# Patient Record
Sex: Female | Born: 1935 | Hispanic: No | Smoking: Former smoker
Health system: Southern US, Community
[De-identification: ages and names within clinical notes are randomized; demographics above are authoritative.]

## PROBLEM LIST (undated history)

## (undated) DIAGNOSIS — N189 Chronic kidney disease, unspecified: Secondary | ICD-10-CM

## (undated) DIAGNOSIS — I4891 Unspecified atrial fibrillation: Secondary | ICD-10-CM

## (undated) DIAGNOSIS — D649 Anemia, unspecified: Secondary | ICD-10-CM

## (undated) DIAGNOSIS — K219 Gastro-esophageal reflux disease without esophagitis: Secondary | ICD-10-CM

---

## 2008-02-23 ENCOUNTER — Ambulatory Visit (HOSPITAL_BASED_OUTPATIENT_CLINIC_OR_DEPARTMENT_OTHER): Admission: RE | Admit: 2008-02-23 | Discharge: 2008-02-23 | Payer: Self-pay | Admitting: Orthopedic Surgery

## 2011-02-26 NOTE — Op Note (Signed)
Mary Mcclain, Mary Mcclain              ACCOUNT NO.:  [id number]   MEDICAL RECORD NO.:  [id number]          PATIENT TYPE:  AMB   LOCATION:  [facility]                         FACILITY:  [facility]   PHYSICIAN:  Deidre Ala, M.D.    DATE OF BIRTH:  [birth year]   DATE OF PROCEDURE:  02/23/2008  DATE OF DISCHARGE:                               OPERATIVE REPORT   PREOPERATIVE DIAGNOSES:  1. Right second hammertoe deformity.  2. Right second metatarsalgia.  3. Right third metatarsalgia.  4. Left second hammertoe deformity.  5. Left second metatarsalgia.  6. Left third metatarsalgia.   POSTOPERATIVE DIAGNOSES:  1. Right second hammertoe deformity.  2. Right second metatarsalgia.  3. Right third metatarsalgia.  4. Left second hammertoe deformity.  5. Left second metatarsalgia.  6. Left third metatarsalgia.   PROCEDURES:  1. Right second toe DeVries hammertoe correction.  2. Dorsal wedge osteotomy second metatarsal neck with local autograft.  3. Dorsal wedge osteotomy third metatarsal neck with local autograft.  4. Left second hammertoe DeVries correction procedure.  5. Left second dorsal wedge osteotomy metatarsal neck with local      autograft.  6. Left third dorsal wedge osteotomy at metatarsal neck with local      autograft.   SURGEON:  1. Charlesetta Shanks, M.D.   ASSISTANT:  Phineas Semen, P.A.   ANESTHESIA:  Ankle blocks with general with face mask.   CULTURES:  None.   DRAINS:  None.   BLOOD LOSS:  Minimal.   TOURNIQUET TIME:  Right Esmarch 30 minutes, left 30 minutes.   PATHOLOGIC FINDINGS AND HISTORY:  Mary Mcclain had had in the past simple  bunionectomies but had residual hallux valgus due to the splayed medial  foot.  She had transfer second and third metatarsalgia with over-long  second and third metatarsals and a left and right hammertoe.  The second  and third metatarsalgia was bilateral.  The hallux valgus was bilateral.  The simple bunionectomies where bilateral with  recurrent hallux valgus  deformity but the MTP joint did not bother her on any side.  So  basically, she had bilateral second hammertoe deformities and second and  third metatarsalgia with over-long second and third metatarsal heads  with plantar callosities.  She did not have a Morton's neuroma.  It was  elected to be conservative and not deal with metatarsus primus varus or  the hallux valgus or the bunion but simply to correct the painful second  hammertoe which was not particularly over or under-riding and the dorsal  wedge on the osteotomies to a single incision on the second and third.  This is what was carried out.   PROCEDURE:  With adequate anesthesia obtained using ankle block  technique and general with face mask, the patient was placed in the  supine position.  Bilateral lower extremities were then prepped from  toes to the upper calf in the standard fashion.  After standard prepping  and draping, right ankle Esmarch examination was used and the Esmarch  left on above the ankle for the tourniquet.  I then made an incision in  the  dorsal web between 2 and 3 and dissected down to the second  metatarsal neck where soft tissue periosteal elevator was used and  retractors placed.  A dorsal wedge osteotomy was then carried out with  an oscillating saw and the skin wedge was morselized and used as bone  graft as the metatarsal head was cracked upward to achieve angular  correction and shortening and not to be so prominent on the bottom of  the foot.  Through the same incision, the exact same procedure was done  on the third.  I then closed the wound with running 4-0 nylon.  Attention was then turned to the second toe where dorsal wedge of skin  in the manner of Mary Mcclain was carried out with the wedge of skin removed  over the PIP joint.  I then excised the extensor tendon.  I then, in a  wedge-shaped fashion with the saw, resected with the apex plantar, the  PIP joint cartilage and  joint surface.  I then released the flexor  tendon.  I then brought the osteotomy together straightening the kinked  hammertoe flexed at the PIP joint to a physiologic curvature of the toe  that rested nicely between the first and second toes.  I then placed two  bolsters on either side of the wound and brought them up with 4-0 nylon  extending the PIP joint and suturing either side of the skin in likewise  fashion.  This was a vertical mattress suture that was used on the  bolsters.  The bolsters were made with a Xeroflo 1/2 inch rolled up.  This corrected the hammertoe.  No additional fixation was used.  Bulky  sterile compressive forefoot dressing was applied after application  locally of 0.25% Marcaine.  That tourniquet was let down after the  dressing was applied.  Attention was turned to the left foot where the  exact same procedure was performed.  The patient then, having tolerated  the procedure well, was awakened and taken to the recovery room in  satisfactory condition, to be discharged per outpatient routine, given  Percocet for pain and told to call the office for appointment for  recheck on Friday.  To be given wooden sole shoes, walking with  weightbearing on her heels, elevation and encroaches.   LABORATORY DATA:  Within normal limits.           ______________________________  V. Charlesetta Shanks, M.D.     VEP/MEDQ  D:  02/23/2008  T:  02/23/2008  Job:  [id number]

## 2015-12-04 ENCOUNTER — Emergency Department (HOSPITAL_COMMUNITY): Payer: Medicare Other

## 2015-12-04 ENCOUNTER — Encounter (HOSPITAL_COMMUNITY): Payer: Self-pay | Admitting: Emergency Medicine

## 2015-12-04 ENCOUNTER — Emergency Department (HOSPITAL_COMMUNITY)
Admission: EM | Admit: 2015-12-04 | Discharge: 2015-12-04 | Disposition: A | Discharge status: Home / Self Care | Payer: Medicare Other | Attending: Emergency Medicine | Admitting: Emergency Medicine

## 2015-12-04 DIAGNOSIS — F0390 Unspecified dementia without behavioral disturbance: Secondary | ICD-10-CM | POA: Diagnosis not present

## 2015-12-04 DIAGNOSIS — Y9389 Activity, other specified: Secondary | ICD-10-CM | POA: Insufficient documentation

## 2015-12-04 DIAGNOSIS — N189 Chronic kidney disease, unspecified: Secondary | ICD-10-CM | POA: Insufficient documentation

## 2015-12-04 DIAGNOSIS — Y998 Other external cause status: Secondary | ICD-10-CM | POA: Diagnosis not present

## 2015-12-04 DIAGNOSIS — Z862 Personal history of diseases of the blood and blood-forming organs and certain disorders involving the immune mechanism: Secondary | ICD-10-CM | POA: Diagnosis not present

## 2015-12-04 DIAGNOSIS — S79911A Unspecified injury of right hip, initial encounter: Secondary | ICD-10-CM | POA: Diagnosis not present

## 2015-12-04 DIAGNOSIS — W19XXXA Unspecified fall, initial encounter: Secondary | ICD-10-CM

## 2015-12-04 DIAGNOSIS — Y9289 Other specified places as the place of occurrence of the external cause: Secondary | ICD-10-CM | POA: Insufficient documentation

## 2015-12-04 DIAGNOSIS — S3992XA Unspecified injury of lower back, initial encounter: Secondary | ICD-10-CM | POA: Diagnosis present

## 2015-12-04 DIAGNOSIS — Z79899 Other long term (current) drug therapy: Secondary | ICD-10-CM | POA: Insufficient documentation

## 2015-12-04 DIAGNOSIS — Z87891 Personal history of nicotine dependence: Secondary | ICD-10-CM | POA: Diagnosis not present

## 2015-12-04 DIAGNOSIS — Z8679 Personal history of other diseases of the circulatory system: Secondary | ICD-10-CM | POA: Diagnosis not present

## 2015-12-04 DIAGNOSIS — W010XXA Fall on same level from slipping, tripping and stumbling without subsequent striking against object, initial encounter: Secondary | ICD-10-CM | POA: Insufficient documentation

## 2015-12-04 HISTORY — DX: Unspecified atrial fibrillation: I48.91

## 2015-12-04 HISTORY — DX: Anemia, unspecified: D64.9

## 2015-12-04 HISTORY — DX: Chronic kidney disease, unspecified: N18.9

## 2015-12-04 NOTE — Discharge Instructions (Signed)
Tylenol for pain.  Follow up if needed 

## 2015-12-04 NOTE — ED Provider Notes (Signed)
CSN: [id number]     Arrival date & time 12/04/15  1929 History   First MD Initiated Contact with Patient 12/04/15 2008     Chief Complaint  Patient presents with  . Fall  . Hip Pain  . Back Pain     (Consider location/radiation/quality/duration/timing/severity/associated sxs/prior Treatment) Patient is a 82 y.o. female presenting with fall, hip pain, and back pain. The history is provided by the EMS personnel (EMS states that there are told the patient was in the chair stood up and kind of slid down and fell. Unknown loss of consciousness).  Fall This is a new problem. The current episode started 1 to 2 hours ago. The problem occurs rarely. The problem has been resolved. Pertinent negatives include no chest pain and no abdominal pain. Nothing aggravates the symptoms. Nothing relieves the symptoms.  Hip Pain Pertinent negatives include no chest pain and no abdominal pain.  Back Pain Associated symptoms: no abdominal pain and no chest pain     Past Medical History  Diagnosis Date  . Chronic kidney disease (CKD)   . Atrial fibrillation (HCC)   . Anemia    History reviewed. No pertinent past surgical history. History reviewed. No pertinent family history. Social History  Substance Use Topics  . Smoking status: Former Games developer  . Smokeless tobacco: Never Used  . Alcohol Use: No   OB History    No data available     Review of Systems  Unable to perform ROS: Dementia  Cardiovascular: Negative for chest pain.  Gastrointestinal: Negative for abdominal pain.  Musculoskeletal: Positive for back pain.      Allergies  Sulfa antibiotics  Home Medications   Prior to Admission medications   Medication Sig Start Date End Date Taking? Authorizing Provider  acetaminophen (TYLENOL) 325 MG tablet Take 650 mg by mouth every 4 (four) hours as needed for mild pain.   Yes Historical Provider, MD  Amino Acids-Protein Hydrolys (FEEDING SUPPLEMENT, PRO-STAT SUGAR FREE 64,) LIQD Take 30 mLs  by mouth 2 (two) times daily.   Yes Historical Provider, MD  cholecalciferol (VITAMIN D) 1000 units tablet Take 1,000 Units by mouth daily.   Yes Historical Provider, MD  pantoprazole (PROTONIX) 40 MG tablet Take 40 mg by mouth daily. 12/01/15  Yes Historical Provider, MD  sertraline (ZOLOFT) 25 MG tablet Take 25 mg by mouth daily. 12/01/15  Yes Historical Provider, MD   BP 141/87 mmHg  Pulse 105  Temp(Src) 98.2 F (36.8 C) (Oral)  Resp 17  SpO2 92% Physical Exam  Constitutional: She appears well-developed.  HENT:  Head: Normocephalic.  Eyes: Conjunctivae and EOM are normal. No scleral icterus.  Neck: Neck supple. No thyromegaly present.  Cardiovascular: Normal rate and regular rhythm.  Exam reveals no gallop and no friction rub.   No murmur heard. Pulmonary/Chest: No stridor. She has no wheezes. She has no rales. She exhibits no tenderness.  Abdominal: She exhibits no distension. There is no tenderness. There is no rebound.  Musculoskeletal: Normal range of motion. She exhibits no edema.  Minor lumbar tenderness and right hip tenderness  Lymphadenopathy:    She has no cervical adenopathy.  Neurological: She is alert. She exhibits normal muscle tone. Coordination normal.  Alert oriented to person only  Skin: No rash noted. No erythema.    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 EXAM: LUMBAR SPINE - COMPLETE 4+ VIEW COMPARISON:  None. FINDINGS: Five non rib-bearing lumbar vertebrae with S1 representing a transitional segment, having a well defined disc space between it and S2. Anatomic alignment. Mild compression fracture of the upper endplate of L1 on the order of 20% or so which does not appear acute. No fractures elsewhere. Moderate  to severe degenerative disc disease and spondylosis at every lumbar level, worst at L2-3 and L4-5. No pars defects. Facet degenerative changes bilaterally at L4-5 and L5-S1. Sacroiliac joints intact. Degenerative changes involving the visualized hip joints. Aortoiliac atherosclerosis without aneurysm. IMPRESSION: 1. No acute osseous abnormality. 2. Old mild compression fracture of the upper endplate of L1 on the order of 20% or so. 3. Moderate to severe multilevel degenerative disc disease and spondylosis, worst at L2-3 and L4-5. Bilateral facet degenerative changes at L4-5 and L5-S1. Electronically Signed   By: Hulan Saas M.D.   On: 12/04/2015 20:57   Ct Head Wo Contrast  12/04/2015  CLINICAL DATA:  82 year old female post unwitnessed fall today. No loss of consciousness. EXAM: CT HEAD WITHOUT CONTRAST CT CERVICAL SPINE WITHOUT CONTRAST TECHNIQUE: Multidetector CT imaging of the head and cervical spine was performed following the standard protocol without intravenous contrast. Multiplanar CT image reconstructions of the cervical spine were also generated. COMPARISON:  None. FINDINGS: CT HEAD FINDINGS The generalized atrophy and moderate to advanced chronic small vessel ischemic change. No intracranial hemorrhage, mass effect, or midline shift. No hydrocephalus. The basilar cisterns are patent. No evidence of territorial infarct. No intracranial fluid collection. Calvarium is intact. Included paranasal sinuses and mastoid air cells are well aerated. CT CERVICAL SPINE FINDINGS There is no fracture. The dens is intact. There are no jumped or perched facets. Disc space narrowing from C4-C5 through C6-C7 with endplate spurring. Scattered facet arthropathy. No prevertebral soft tissue edema. IMPRESSION: 1. Atrophy and chronic small vessel ischemia, no acute intracranial abnormality. 2. Degenerative change in the cervical spine without acute fracture or subluxation. Electronically Signed   By: Rubye Oaks  M.D.   On: 12/04/2015 21:15   Ct Cervical Spine Wo Contrast  12/04/2015  CLINICAL DATA:  82 year old female post unwitnessed fall today. No loss of consciousness. EXAM: CT HEAD WITHOUT CONTRAST CT CERVICAL SPINE WITHOUT CONTRAST TECHNIQUE: Multidetector CT imaging of the head and cervical spine was performed following the standard protocol without intravenous contrast. Multiplanar CT image reconstructions of the cervical spine were also generated. COMPARISON:  None. FINDINGS: CT HEAD FINDINGS The generalized atrophy and moderate to advanced chronic small vessel ischemic change. No intracranial hemorrhage, mass effect, or midline shift. No hydrocephalus. The basilar cisterns are patent. No evidence of territorial infarct. No intracranial fluid collection. Calvarium is intact. Included paranasal sinuses and mastoid air cells are well aerated. CT CERVICAL SPINE FINDINGS There is no fracture. The dens is intact. There are no jumped or perched facets. Disc space narrowing from C4-C5 through C6-C7 with endplate spurring. Scattered facet arthropathy. No prevertebral soft tissue edema. IMPRESSION: 1. Atrophy and chronic small vessel ischemia, no acute intracranial abnormality. 2. Degenerative change in the cervical spine without acute fracture or subluxation. Electronically Signed   By: Rubye Oaks M.D.   On: 12/04/2015 21:15   Dg Hip Unilat With Pelvis 2-3 Views Right  12/04/2015  CLINICAL DATA:  Unwitnessed fall around 4 p.m. today. EXAM: DG HIP (WITH OR WITHOUT PELVIS) 2-3V RIGHT COMPARISON:  None.  FINDINGS: Frontal pelvis shows no fracture. SI joints and symphysis pubis are unremarkable. AP and frog-leg lateral views of the right hip show no evidence for right femoral neck fracture. IMPRESSION: Negative. Electronically Signed   By: Kennith Center M.D.   On: 12/04/2015 20:56   I have personally reviewed and evaluated these images and lab results as part of my medical decision-making.   EKG  Interpretation None      MDM   Final diagnoses:  Fall, initial encounter    CT head CT cervical spine lumbar spine and right hip show no acute changes patient presently not in any pain she will be discharged and will take Tylenol as needed    Bethann Berkshire, MD 12/04/15 2140

## 2015-12-04 NOTE — ED Notes (Signed)
PTAR picking up patient. 

## 2015-12-04 NOTE — ED Notes (Signed)
Patient is discharged waiting on PTAR 

## 2015-12-04 NOTE — ED Notes (Signed)
[facility] presents with 82 yo female from [facility] with an unwitnessed fall that took place around 4 pm today.  Patient was in a lift recliner when she stood up and slid down.  Pt stated that she didn't hit her head and No LOC, No neck pain.  Staff helped patient to her feet but when they checked on her later she was in pain.  Currently 8 out of 10 on pain scale and worse with movement.  Lower back tenderness and right shoulder tenderness.

## 2015-12-04 NOTE — ED Notes (Signed)
Bed: [facility] Expected date:  Expected time:  Means of arrival:  Comments: EMS- 82yo F, fall/hip and shoulder pain

## 2015-12-06 ENCOUNTER — Encounter (HOSPITAL_COMMUNITY): Payer: Self-pay | Admitting: Emergency Medicine

## 2015-12-06 ENCOUNTER — Inpatient Hospital Stay (HOSPITAL_COMMUNITY): Payer: Medicare Other

## 2015-12-06 ENCOUNTER — Inpatient Hospital Stay (HOSPITAL_COMMUNITY)
Admission: EM | Admit: 2015-12-06 | Discharge: 2015-12-07 | DRG: 690 | Disposition: A | Discharge status: Nursing Home (Includes ICF) | Payer: Medicare Other | Attending: Internal Medicine | Admitting: Internal Medicine

## 2015-12-06 ENCOUNTER — Emergency Department (HOSPITAL_COMMUNITY): Payer: Medicare Other

## 2015-12-06 DIAGNOSIS — I129 Hypertensive chronic kidney disease with stage 1 through stage 4 chronic kidney disease, or unspecified chronic kidney disease: Secondary | ICD-10-CM | POA: Diagnosis not present

## 2015-12-06 DIAGNOSIS — E860 Dehydration: Secondary | ICD-10-CM | POA: Diagnosis not present

## 2015-12-06 DIAGNOSIS — Z7901 Long term (current) use of anticoagulants: Secondary | ICD-10-CM

## 2015-12-06 DIAGNOSIS — G9340 Encephalopathy, unspecified: Secondary | ICD-10-CM | POA: Diagnosis not present

## 2015-12-06 DIAGNOSIS — E876 Hypokalemia: Secondary | ICD-10-CM | POA: Diagnosis present

## 2015-12-06 DIAGNOSIS — I482 Chronic atrial fibrillation, unspecified: Secondary | ICD-10-CM

## 2015-12-06 DIAGNOSIS — N390 Urinary tract infection, site not specified: Secondary | ICD-10-CM

## 2015-12-06 DIAGNOSIS — F329 Major depressive disorder, single episode, unspecified: Secondary | ICD-10-CM | POA: Diagnosis not present

## 2015-12-06 DIAGNOSIS — I700 Atherosclerosis of aorta: Secondary | ICD-10-CM | POA: Diagnosis not present

## 2015-12-06 DIAGNOSIS — N183 Hypertensive chronic kidney disease with stage 1 through stage 4 chronic kidney disease, or unspecified chronic kidney disease: Secondary | ICD-10-CM | POA: Insufficient documentation

## 2015-12-06 DIAGNOSIS — M545 Low back pain: Secondary | ICD-10-CM | POA: Diagnosis present

## 2015-12-06 DIAGNOSIS — D649 Anemia, unspecified: Secondary | ICD-10-CM | POA: Diagnosis not present

## 2015-12-06 DIAGNOSIS — N179 Acute kidney failure, unspecified: Secondary | ICD-10-CM | POA: Diagnosis present

## 2015-12-06 DIAGNOSIS — Z882 Allergy status to sulfonamides status: Secondary | ICD-10-CM | POA: Diagnosis not present

## 2015-12-06 DIAGNOSIS — N3000 Acute cystitis without hematuria: Secondary | ICD-10-CM | POA: Diagnosis not present

## 2015-12-06 DIAGNOSIS — R4182 Altered mental status, unspecified: Secondary | ICD-10-CM

## 2015-12-06 DIAGNOSIS — Z87891 Personal history of nicotine dependence: Secondary | ICD-10-CM | POA: Diagnosis not present

## 2015-12-06 DIAGNOSIS — J029 Acute pharyngitis, unspecified: Secondary | ICD-10-CM | POA: Diagnosis present

## 2015-12-06 DIAGNOSIS — N189 Chronic kidney disease, unspecified: Secondary | ICD-10-CM

## 2015-12-06 DIAGNOSIS — B9689 Other specified bacterial agents as the cause of diseases classified elsewhere: Secondary | ICD-10-CM | POA: Diagnosis not present

## 2015-12-06 LAB — URINALYSIS, ROUTINE W REFLEX MICROSCOPIC
GLUCOSE, UA: NEGATIVE mg/dL
HGB URINE DIPSTICK: NEGATIVE
KETONES UR: NEGATIVE mg/dL
Nitrite: NEGATIVE
PH: 5.5 (ref 5.0–8.0)
PROTEIN: NEGATIVE mg/dL
Specific Gravity, Urine: 1.014 (ref 1.005–1.030)

## 2015-12-06 LAB — CBC WITH DIFFERENTIAL/PLATELET
BASOS ABS: 0 10*3/uL (ref 0.0–0.1)
Basophils Relative: 0 %
EOS PCT: 2 %
Eosinophils Absolute: 0.2 10*3/uL (ref 0.0–0.7)
HEMATOCRIT: 35.1 % — AB (ref 36.0–46.0)
Hemoglobin: 10.3 g/dL — ABNORMAL LOW (ref 12.0–15.0)
LYMPHS PCT: 11 %
Lymphs Abs: 1 10*3/uL (ref 0.7–4.0)
MCH: 22.5 pg — ABNORMAL LOW (ref 26.0–34.0)
MCHC: 29.3 g/dL — AB (ref 30.0–36.0)
MCV: 76.6 fL — AB (ref 78.0–100.0)
MONO ABS: 0.7 10*3/uL (ref 0.1–1.0)
MONOS PCT: 7 %
NEUTROS ABS: 7.5 10*3/uL (ref 1.7–7.7)
Neutrophils Relative %: 80 %
PLATELETS: 296 10*3/uL (ref 150–400)
RBC: 4.58 MIL/uL (ref 3.87–5.11)
RDW: 18.3 % — AB (ref 11.5–15.5)
WBC: 9.3 10*3/uL (ref 4.0–10.5)

## 2015-12-06 LAB — URINE MICROSCOPIC-ADD ON: RBC / HPF: NONE SEEN RBC/hpf (ref 0–5)

## 2015-12-06 LAB — COMPREHENSIVE METABOLIC PANEL
ALBUMIN: 3.1 g/dL — AB (ref 3.5–5.0)
ALT: 10 U/L — ABNORMAL LOW (ref 14–54)
AST: 17 U/L (ref 15–41)
Alkaline Phosphatase: 58 U/L (ref 38–126)
Anion gap: 17 — ABNORMAL HIGH (ref 5–15)
BUN: 38 mg/dL — AB (ref 6–20)
CHLORIDE: 101 mmol/L (ref 101–111)
CO2: 22 mmol/L (ref 22–32)
Calcium: 9.6 mg/dL (ref 8.9–10.3)
Creatinine, Ser: 1.76 mg/dL — ABNORMAL HIGH (ref 0.44–1.00)
GFR calc Af Amer: 27 mL/min — ABNORMAL LOW (ref >60)
GFR calc non Af Amer: 23 mL/min — ABNORMAL LOW (ref >60)
GLUCOSE: 125 mg/dL — AB (ref 65–99)
POTASSIUM: 3.2 mmol/L — AB (ref 3.5–5.1)
SODIUM: 140 mmol/L (ref 135–145)
Total Bilirubin: 0.3 mg/dL (ref 0.3–1.2)
Total Protein: 6.8 g/dL (ref 6.5–8.1)

## 2015-12-06 LAB — RAPID URINE DRUG SCREEN, HOSP PERFORMED
Amphetamines: POSITIVE — AB
BARBITURATES: NOT DETECTED
Benzodiazepines: NOT DETECTED
Cocaine: NOT DETECTED
Opiates: NOT DETECTED
Tetrahydrocannabinol: NOT DETECTED

## 2015-12-06 LAB — PROTIME-INR
INR: 1.36 (ref 0.00–1.49)
PROTHROMBIN TIME: 16.9 s — AB (ref 11.6–15.2)

## 2015-12-06 LAB — APTT: aPTT: 33 seconds (ref 24–37)

## 2015-12-06 MED ORDER — VITAMIN D 1000 UNITS PO TABS
1000.0000 [IU] | ORAL_TABLET | Freq: Every day | ORAL | Status: DC
  Administered 2015-12-07: 1000 [IU] via ORAL
  Filled 2015-12-06: qty 1

## 2015-12-06 MED ORDER — SODIUM CHLORIDE 0.9% FLUSH
3.0000 mL | Freq: Two times a day (BID) | INTRAVENOUS | Status: DC
  Administered 2015-12-06 – 2015-12-07 (×2): 3 mL via INTRAVENOUS

## 2015-12-06 MED ORDER — SODIUM CHLORIDE 0.9 % IV SOLN
INTRAVENOUS | Status: DC
  Administered 2015-12-06: 12:00:00 via INTRAVENOUS

## 2015-12-06 MED ORDER — SERTRALINE HCL 50 MG PO TABS
25.0000 mg | ORAL_TABLET | Freq: Every day | ORAL | Status: DC
  Administered 2015-12-07: 25 mg via ORAL
  Filled 2015-12-06: qty 1

## 2015-12-06 MED ORDER — WARFARIN - PHARMACIST DOSING INPATIENT: Freq: Every day | Status: DC

## 2015-12-06 MED ORDER — DICLOFENAC SODIUM 1 % TD GEL
2.0000 g | Freq: Four times a day (QID) | TRANSDERMAL | Status: DC | PRN
  Administered 2015-12-07: 2 g via TOPICAL
  Filled 2015-12-06: qty 100

## 2015-12-06 MED ORDER — SODIUM CHLORIDE 0.9 % IV SOLN
INTRAVENOUS | Status: AC
  Administered 2015-12-06 – 2015-12-07 (×2): 100 mL/h via INTRAVENOUS

## 2015-12-06 MED ORDER — PANTOPRAZOLE SODIUM 40 MG PO TBEC
40.0000 mg | DELAYED_RELEASE_TABLET | Freq: Every day | ORAL | Status: DC
  Administered 2015-12-07: 40 mg via ORAL
  Filled 2015-12-06: qty 1

## 2015-12-06 MED ORDER — BARIUM SULFATE 2.1 % PO SUSP
ORAL | Status: AC
  Filled 2015-12-06: qty 2

## 2015-12-06 MED ORDER — DEXTROSE 5 % IV SOLN
1.0000 g | INTRAVENOUS | Status: DC
  Administered 2015-12-06: 1 g via INTRAVENOUS
  Filled 2015-12-06 (×2): qty 10

## 2015-12-06 MED ORDER — PRO-STAT SUGAR FREE PO LIQD
30.0000 mL | Freq: Two times a day (BID) | ORAL | Status: DC
  Administered 2015-12-06 – 2015-12-07 (×2): 30 mL via ORAL
  Filled 2015-12-06 (×2): qty 30

## 2015-12-06 MED ORDER — POTASSIUM CHLORIDE CRYS ER 20 MEQ PO TBCR
20.0000 meq | EXTENDED_RELEASE_TABLET | Freq: Once | ORAL | Status: AC
  Administered 2015-12-06: 20 meq via ORAL
  Filled 2015-12-06: qty 1

## 2015-12-06 MED ORDER — ACETAMINOPHEN 325 MG PO TABS
650.0000 mg | ORAL_TABLET | Freq: Four times a day (QID) | ORAL | Status: DC | PRN
  Administered 2015-12-06: 650 mg via ORAL
  Filled 2015-12-06: qty 2

## 2015-12-06 MED ORDER — WARFARIN SODIUM 5 MG PO TABS
5.0000 mg | ORAL_TABLET | Freq: Once | ORAL | Status: AC
  Administered 2015-12-06: 5 mg via ORAL
  Filled 2015-12-06 (×2): qty 1

## 2015-12-06 NOTE — ED Provider Notes (Signed)
CSN: [id number]     Arrival date & time 12/06/15  1035 History   First MD Initiated Contact with Patient 12/06/15 1052     Chief Complaint  Patient presents with  . Weakness     (Consider location/radiation/quality/duration/timing/severity/associated sxs/prior Treatment) HPI   Level V caveat applies due to confusion.  Mary Mcclain is a 82 y.o. female, with a history of A. fib, anemia, and CKD, presenting to the ED with reported increased weakness and confusion. Pt is from [facility]). Patient does not answer when asked if she is having any pain or other complaints.    Past Medical History  Diagnosis Date  . Chronic kidney disease (CKD)   . Atrial fibrillation (HCC)   . Anemia    No past surgical history on file. No family history on file. Social History  Substance Use Topics  . Smoking status: Former Games developer  . Smokeless tobacco: Never Used  . Alcohol Use: No   OB History    No data available     Review of Systems  Unable to perform ROS: Dementia  Neurological: Positive for weakness.  Psychiatric/Behavioral: Positive for confusion.      Allergies  Sulfa antibiotics  Home Medications   Prior to Admission medications   Medication Sig Start Date End Date Taking? Authorizing Provider  pantoprazole (PROTONIX) 40 MG tablet Take 40 mg by mouth daily. 12/01/15  Yes Historical Provider, MD  sertraline (ZOLOFT) 25 MG tablet Take 25 mg by mouth daily. 12/01/15  Yes Historical Provider, MD  warfarin (COUMADIN) 2 MG tablet Take 2 mg by mouth every Tuesday, Thursday, Saturday, and Sunday at 6 PM.   Yes Historical Provider, MD  warfarin (COUMADIN) 2.5 MG tablet Take 2.5 mg by mouth every Monday, Wednesday, and Friday.   Yes Historical Provider, MD  acetaminophen (TYLENOL) 325 MG tablet Take 650 mg by mouth every 4 (four) hours as needed for mild pain.    Historical Provider, MD  Amino Acids-Protein Hydrolys (FEEDING SUPPLEMENT, PRO-STAT  SUGAR FREE 64,) LIQD Take 30 mLs by mouth 2 (two) times daily.    Historical Provider, MD  cholecalciferol (VITAMIN D) 1000 units tablet Take 1,000 Units by mouth daily.    Historical Provider, MD   BP 157/100 mmHg  Pulse 100  Temp(Src) 98 F (36.7 C) (Rectal)  Resp 14  SpO2 96% Physical Exam  Constitutional: She appears well-developed and well-nourished. No distress.  HENT:  Head: Normocephalic and atraumatic.  Eyes: Conjunctivae are normal. Pupils are equal, round, and reactive to light.  Pt would not follow commands for EOM exam.  Neck: Neck supple.  Cardiovascular: Normal rate, regular rhythm, normal heart sounds and intact distal pulses.   Pulmonary/Chest: Effort normal and breath sounds normal. No respiratory distress.  Abdominal: Soft. Bowel sounds are normal. There is tenderness in the periumbilical area. There is no guarding.  Ping-pong sized abdominal mass on the midline just superior to the umbilicus. Mass is not reducible and is tender.  Musculoskeletal: She exhibits no tenderness.  No paraspinal tenderness. Pt would not comply with ROM exam. No tenderness elicited during overall trauma exam. No wounds found. Peripheral edema present at the ankles.  Lymphadenopathy:    She has no cervical adenopathy.  Neurological: She is alert. She has normal reflexes.  Oriented to self only. Would only comply with half of the neuro exam. Grip strengths seem to be equal, but weak. Pt would not move her legs.   Skin: Skin is warm  and dry. She is not diaphoretic.  Skin appears to be globally intact. No rashes noted.   Nursing note and vitals reviewed.   ED Course  Procedures (including critical care time) Labs Review Labs Reviewed  CBC WITH DIFFERENTIAL/PLATELET - Abnormal; Notable for the following:    Hemoglobin 10.3 (*)    HCT 35.1 (*)    MCV 76.6 (*)    MCH 22.5 (*)    MCHC 29.3 (*)    RDW 18.3 (*)    All other components within normal limits  URINALYSIS, ROUTINE W REFLEX  MICROSCOPIC (NOT AT [facility]) - Abnormal; Notable for the following:    Color, Urine AMBER (*)    APPearance CLOUDY (*)    Bilirubin Urine SMALL (*)    Leukocytes, UA MODERATE (*)    All other components within normal limits  URINE RAPID DRUG SCREEN, HOSP PERFORMED - Abnormal; Notable for the following:    Amphetamines POSITIVE (*)    All other components within normal limits  COMPREHENSIVE METABOLIC PANEL - Abnormal; Notable for the following:    Potassium 3.2 (*)    Glucose, Bld 125 (*)    BUN 38 (*)    Creatinine, Ser 1.76 (*)    Albumin 3.1 (*)    ALT 10 (*)    GFR calc non Af Amer 23 (*)    GFR calc Af Amer 27 (*)    Anion gap 17 (*)    All other components within normal limits  PROTIME-INR - Abnormal; Notable for the following:    Prothrombin Time 16.9 (*)    All other components within normal limits  URINE MICROSCOPIC-ADD ON - Abnormal; Notable for the following:    Squamous Epithelial / LPF 0-5 (*)    Bacteria, UA MANY (*)    Casts HYALINE CASTS (*)    All other components within normal limits  URINE CULTURE  APTT    Imaging Review Dg Chest 2 View  12/06/2015  CLINICAL DATA:  82 year old female with weakness and confusion. Initial encounter. EXAM: CHEST  2 VIEW COMPARISON:  02/23/2008. FINDINGS: Semi upright AP and lateral views of the chest. Mild to moderate cardiomegaly has increased since 2009. Other mediastinal contours remain within normal limits. Lower lung volumes. No pneumothorax, pulmonary edema, pleural effusion or consolidation. Patchy 11 mm lung nodule in the right lung projecting at the minor fissure. Osteopenia. Calcified aortic atherosclerosis. IMPRESSION: 1. Cardiomegaly has developed since 2009.  Lower lung volumes. 2. Right mid lung 11 mm pulmonary nodule is new, but might be inflammatory or due to scarring. In this setting, lower followup PA and lateral chest X-ray could be performed in 3 months to evaluate persistence. Electronically Signed   By: Odessa Fleming M.D.    On: 12/06/2015 11:38   Dg Lumbar Spine Complete  12/04/2015  CLINICAL DATA:  82 year old nursing home patient who had an unwitnessed fall around 4 o'clock p.m., complaining of severe low back pain. EXAM: LUMBAR SPINE - COMPLETE 4+ VIEW COMPARISON:  None. FINDINGS: Five non rib-bearing lumbar vertebrae with S1 representing a transitional segment, having a well defined disc space between it and S2. Anatomic alignment. Mild compression fracture of the upper endplate of L1 on the order of 20% or so which does not appear acute. No fractures elsewhere. Moderate to severe degenerative disc disease and spondylosis at every lumbar level, worst at L2-3 and L4-5. No pars defects. Facet degenerative changes bilaterally at L4-5 and L5-S1. Sacroiliac joints intact. Degenerative changes involving the visualized hip  joints. Aortoiliac atherosclerosis without aneurysm. IMPRESSION: 1. No acute osseous abnormality. 2. Old mild compression fracture of the upper endplate of L1 on the order of 20% or so. 3. Moderate to severe multilevel degenerative disc disease and spondylosis, worst at L2-3 and L4-5. Bilateral facet degenerative changes at L4-5 and L5-S1. Electronically Signed   By: Hulan Saas M.D.   On: 12/04/2015 20:57   Ct Head Wo Contrast  12/04/2015  CLINICAL DATA:  82 year old female post unwitnessed fall today. No loss of consciousness. EXAM: CT HEAD WITHOUT CONTRAST CT CERVICAL SPINE WITHOUT CONTRAST TECHNIQUE: Multidetector CT imaging of the head and cervical spine was performed following the standard protocol without intravenous contrast. Multiplanar CT image reconstructions of the cervical spine were also generated. COMPARISON:  None. FINDINGS: CT HEAD FINDINGS The generalized atrophy and moderate to advanced chronic small vessel ischemic change. No intracranial hemorrhage, mass effect, or midline shift. No hydrocephalus. The basilar cisterns are patent. No evidence of territorial infarct. No intracranial fluid  collection. Calvarium is intact. Included paranasal sinuses and mastoid air cells are well aerated. CT CERVICAL SPINE FINDINGS There is no fracture. The dens is intact. There are no jumped or perched facets. Disc space narrowing from C4-C5 through C6-C7 with endplate spurring. Scattered facet arthropathy. No prevertebral soft tissue edema. IMPRESSION: 1. Atrophy and chronic small vessel ischemia, no acute intracranial abnormality. 2. Degenerative change in the cervical spine without acute fracture or subluxation. Electronically Signed   By: Rubye Oaks M.D.   On: 12/04/2015 21:15   Ct Cervical Spine Wo Contrast  12/04/2015  CLINICAL DATA:  82 year old female post unwitnessed fall today. No loss of consciousness. EXAM: CT HEAD WITHOUT CONTRAST CT CERVICAL SPINE WITHOUT CONTRAST TECHNIQUE: Multidetector CT imaging of the head and cervical spine was performed following the standard protocol without intravenous contrast. Multiplanar CT image reconstructions of the cervical spine were also generated. COMPARISON:  None. FINDINGS: CT HEAD FINDINGS The generalized atrophy and moderate to advanced chronic small vessel ischemic change. No intracranial hemorrhage, mass effect, or midline shift. No hydrocephalus. The basilar cisterns are patent. No evidence of territorial infarct. No intracranial fluid collection. Calvarium is intact. Included paranasal sinuses and mastoid air cells are well aerated. CT CERVICAL SPINE FINDINGS There is no fracture. The dens is intact. There are no jumped or perched facets. Disc space narrowing from C4-C5 through C6-C7 with endplate spurring. Scattered facet arthropathy. No prevertebral soft tissue edema. IMPRESSION: 1. Atrophy and chronic small vessel ischemia, no acute intracranial abnormality. 2. Degenerative change in the cervical spine without acute fracture or subluxation. Electronically Signed   By: Rubye Oaks M.D.   On: 12/04/2015 21:15   Dg Hip Unilat With Pelvis 2-3  Views Right  12/04/2015  CLINICAL DATA:  Unwitnessed fall around 4 p.m. today. EXAM: DG HIP (WITH OR WITHOUT PELVIS) 2-3V RIGHT COMPARISON:  None. FINDINGS: Frontal pelvis shows no fracture. SI joints and symphysis pubis are unremarkable. AP and frog-leg lateral views of the right hip show no evidence for right femoral neck fracture. IMPRESSION: Negative. Electronically Signed   By: Kennith Center M.D.   On: 12/04/2015 20:56   I have personally reviewed and evaluated these images and lab results as part of my medical decision-making.   EKG Interpretation   Date/Time:  Wednesday December 06 2015 10:46:28 EST Ventricular Rate:  103 PR Interval:  52 QRS Duration: 102 QT Interval:  347 QTC Calculation: 454 R Axis:   -29 Text Interpretation:  Atrial fibrillation Atrial premature complex  Abnormal R-wave progression, late transition Inferior infarct, age  indeterminate Baseline wander in lead(s) I V2 Since last tracing Atrial  fibrillation now present. Confirmed by Hyacinth Meeker  MD, BRIAN ([id number]) on  12/06/2015 11:13:37 AM      MDM   Final diagnoses:  Altered mental status, unspecified altered mental status type  Hypokalemia  Anemia, unspecified anemia type  Acute cystitis without hematuria    Mary Mcclain presents with reported increased confusion and weakness for an unknown amount of time.  Findings and plan of care discussed with Eber Hong, MD. Dr. Hyacinth Meeker evaluated and examined this patient personally.  Altered mental status, weakness, and tachycardia. Admission for altered mental status workup. At this time is unknown how much of the patient's presentation is chronic versus acute. The assumption will have to be made that the significant abnormal findings are acute for the time being. If creatinine allows, patient will receive abdominal CT with contrast. 11:34 AM A voicemail was left with the nursing staff at [facility]. 12:09 PM Spoke with Dario Guardian, patient's grandson  and POA [phone]). States the patient has intermittent confusion but can usually carry on a conversation. Usually able to get up and ambulate to the bathroom with a walker. She has trouble with short-term memory, long term should be intact. Mr. Kizzie Bane saw and spoke to her yesterday and she seemed normal.  As far as her wishes for treatment: Does not want CPR or treatment for cardiac arrest. Nothing in writing. Would probably want surgery depending on the extent.  Mr. Kizzie Bane would like to have an update once results are in. Stated he would also contact the patient's son (Mr. Kizzie Bane' uncle) and POA, Crystalann Korf, and give him an update. 12:23 PM Spoke with Phineas Semen, who confirmed what Mr. Kizzie Bane said about the patient's usual mental status and physical abilities. States the patient has been battling depression for years but that it has increased significantly in the last few weeks. Also requested a call back with an update [phone]. States that the facility called and told him that the patient was the one who requested to go to the hospital.  12:55 PM Pt is now more alert and will answer some questions like who she is and that she is not having any pain. Pt does not know where she is or why she was brought here. Patient does not remember requesting to come to the hospital. Janan Halter through this subsequent interview, the patient looked away, stopped talking, and kept saying "It doesn't matter none of this matters." When asked what she meant, the patient replied, "Nothing that I say matters. It's all meaningless." Patient would not answer whether or not she wants to hurt herself. 2:40 PM spoke with Dr. Andrey Campanile, family medicine resident, who agreed to admit the patient to telemetry. Attending physician will be Doneen Poisson. No further instructions.  Problems: Altered Mental Status: Admission and further workup UTI: IV Rocephin Anemia: Probably chronic Amphetamines: Pt will not answer about  this and no likely drugs on her medication list. Hypokalemia: Oral replacement  Filed Vitals:   12/06/15 1230 12/06/15 1300 12/06/15 1315 12/06/15 1400  BP: 155/87 156/94 164/81 157/100  Pulse: 98  108 100  Temp:      TempSrc:      Resp: SpO2: 94%  95% 96%     Anselm Pancoast, PA-C 12/06/15 1447  Eber Hong, MD 12/06/15 1558

## 2015-12-06 NOTE — Consult Note (Signed)
ANTICOAGULATION CONSULT NOTE - Initial Consult  Pharmacy Consult for Coumadin Indication: atrial fibrillation  Allergies  Allergen Reactions  . Sulfa Antibiotics Other (See Comments)    Pt stated it has been so long ago that she didn't remember what happens    Vital Signs: Temp: 98 F (36.7 C) (02/22 1220) Temp Source: Rectal (02/22 1220) BP: 155/93 mmHg (02/22 1600) Pulse Rate: 103 (02/22 1600)  Labs:  Recent Labs  12/06/15 1156  HGB 10.3*  HCT 35.1*  PLT 296  APTT 33  LABPROT 16.9*  INR 1.36  CREATININE 1.76*    CrCl cannot be calculated (Unknown ideal weight.).   Medical History: Past Medical History  Diagnosis Date  . Chronic kidney disease (CKD)   . Atrial fibrillation (HCC)   . Anemia    Assessment: 89yof on coumadin pta for afib, being admitted with weakness and confusion. CT head from 2 days ago negative. Coumadin to continue. INR on admission is below goal at 1.36.  Home dose: 2.5mg  MWF,  TTSS - last dose 2/21   Goal of Therapy:  INR 2-3 Monitor platelets by anticoagulation protocol: Yes   Plan:  1) Coumadin  x 1 tonight 2) Daily INR  Fredrik Rigger 12/06/2015,4:29 PM

## 2015-12-06 NOTE — H&P (Signed)
Date: 12/06/2015               Patient Name:  Mary Mcclain MRN: [id number]  DOB: 1917/04/27 Age / Sex: 82 y.o., female   PCP: No primary care provider on file.         Medical Service: Internal Medicine Teaching Service         Attending Physician: Dr. Doneen Poisson, MD    First Contact: Dr. Darreld Mclean Pager: [phone]  Second Contact: Dr. Jill Alexanders Pager: [phone]       After Hours (After 5p/  First Contact Pager: [phone]  weekends / holidays): Second Contact Pager: [phone]   Chief Complaint: Confusion  History of Present Illness: Ms. Mary Mcclain is a 82 year old female with PMH of Atrial fibrillation on coumadin, CKD stage 3, and depression who presented from [facility]) for reported confusion. When seen in the ED, patient's main complaint was for sore throat and difficulty swallowing. She also complains of back pain due to a recent fall 2 days ago. She is able to tell me her name, her grandson and oldest son's names, that she lives in [place], and her year of birth. She does repeatedly ask where she is and when told, she wonders why she is here. She denies any chest pain, burning on urination, urgency, frequency, or chest pain. She says she has not had a bowel movement recently. Otherwise review of systems is limited. Patient's family was not present during examination to provide additional history, however on chart review, patient apparently has a baseline intermittent confusion with the usual ability to carry conversations.   Meds: Current Facility-Administered Medications  Medication Dose Route Frequency Provider Last Rate Last Dose  . 0.9 %  sodium chloride infusion   Intravenous Continuous Yolanda Manges, DO 100 mL/hr at 12/06/15 2025 100 mL/hr at 12/06/15 2025  . acetaminophen (TYLENOL) tablet 650 mg  650 mg Oral Q6H PRN Yolanda Manges, DO      . Barium Sulfate 2.1 % SUSP           . cefTRIAXone (ROCEPHIN) 1 g in dextrose 5 % 50  mL IVPB  1 g Intravenous Q24H Faye Ramsay Rumbarger, RPH   Stopped at 12/06/15 1614  . [START ON 12/07/2015] cholecalciferol (VITAMIN D) tablet 1,000 Units  1,000 Units Oral Daily Yolanda Manges, DO      . diclofenac sodium (VOLTAREN) 1 % transdermal gel 2 g  2 g Topical QID PRN Yolanda Manges, DO      . feeding supplement (PRO-STAT SUGAR FREE 64) liquid 30 mL  30 mL Oral BID Yolanda Manges, DO      . Melene Muller ON 12/07/2015] pantoprazole (PROTONIX) EC tablet 40 mg  40 mg Oral Daily Yolanda Manges, DO      . Melene Muller ON 12/07/2015] sertraline (ZOLOFT) tablet 25 mg  25 mg Oral Daily Yolanda Manges, DO      . sodium chloride flush (NS) 0.9 % injection 3 mL  3 mL Intravenous Q12H Yolanda Manges, DO   3 mL at 12/06/15 2026  . Warfarin - Pharmacist Dosing Inpatient   Does not apply q1800 Emi Holes, [facility]        Allergies: Allergies as of 12/06/2015 - Review Complete 12/06/2015  Allergen Reaction Noted  . Sulfa antibiotics Other (See Comments) 12/04/2015   Past Medical History  Diagnosis Date  . Chronic kidney disease (CKD)   . Atrial fibrillation (  HCC)   . Anemia    No past surgical history on file. No family history on file. Social History   Social History  . Marital Status: Widowed    Spouse Name: N/A  . Number of Children: N/A  . Years of Education: N/A   Occupational History  . Not on file.   Social History Main Topics  . Smoking status: Former Games developer  . Smokeless tobacco: Never Used  . Alcohol Use: No  . Drug Use: No  . Sexual Activity: No   Other Topics Concern  . Not on file   Social History Narrative    Review of Systems: Limited as patient displays some confusion Review of Systems  Constitutional: Negative for fever, chills and diaphoresis.  HENT: Positive for sore throat.        Difficulty swallowing  Respiratory: Negative for cough.   Cardiovascular: Negative for chest pain and palpitations.  Gastrointestinal: Negative for nausea, vomiting and diarrhea.    Genitourinary: Negative for dysuria, urgency and frequency.  Musculoskeletal: Positive for back pain and falls.  Neurological: Negative for headaches.    Physical Exam: Blood pressure 183/97, pulse 102, temperature 97.5 F (36.4 C), temperature source Oral, resp. rate 15, SpO2 98 %. Physical Exam  Constitutional: No distress.  Elderly, ill-appearing woman laying in bed, no acute distress  HENT:  Head: Normocephalic and atraumatic.  Mouth/Throat: Oropharynx is clear and moist. No oropharyngeal exudate.  Uvula enlarged with round shape, mid-line, no erythema or exudate, mucosa intact  Eyes: Pupils are equal, round, and reactive to light. No scleral icterus.  Neck: Normal range of motion. Neck supple. No tracheal deviation present.  Cardiovascular: Intact distal pulses.  An irregularly irregular rhythm present. Tachycardia present.  Exam reveals no friction rub.   No murmur heard. Pulmonary/Chest:  Lungs clear to auscultation anteriorly, no appreciable wheezing, rales, or rhonchi. Limited exam on posterior due to pain.  Abdominal: Soft.  Hypoactive bowel sounds. Umbilical hernia, reducible, non-tender with light palpation. Patient initially winced when palpating LUQ, but not with further palpation. Mild tenderness left quadrants.  Musculoskeletal: She exhibits edema and tenderness.  Pitting edema b/l lower extremities, tender to palpation. ROM limited due to pain with sitting up.  Lymphadenopathy:    She has no cervical adenopathy.  Neurological: She is alert.  Patient able to tell me her name, year of birth, hometown, and grandson's name. Not oriented to year.  UE strength intact, finger squeeze intact.   Skin: Skin is dry. She is not diaphoretic.  Dry, thin skin. Stasis dermatitis appearance lower extremity ankles, left > right.  Psychiatric:  Repetitive questioning of where she is and why she is here. Depressed mood.     Lab results: Basic Metabolic Panel:  Recent Labs   12/06/15 1156  NA 140  K 3.2*  CL 101  CO2 22  GLUCOSE 125*  BUN 38*  CREATININE 1.76*  CALCIUM 9.6   Liver Function Tests:  Recent Labs  12/06/15 1156  AST 17  ALT 10*  ALKPHOS 58  BILITOT 0.3  PROT 6.8  ALBUMIN 3.1*   No results for input(s): LIPASE, AMYLASE in the last 72 hours. No results for input(s): AMMONIA in the last 72 hours. CBC:  Recent Labs  12/06/15 1156  WBC 9.3  NEUTROABS 7.5  HGB 10.3*  HCT 35.1*  MCV 76.6*  PLT 296   Cardiac Enzymes: No results for input(s): CKTOTAL, CKMB, CKMBINDEX, TROPONINI in the last 72 hours. BNP: No results for input(s): PROBNP  in the last 72 hours. D-Dimer: No results for input(s): DDIMER in the last 72 hours. CBG: No results for input(s): GLUCAP in the last 72 hours. Hemoglobin A1C: No results for input(s): HGBA1C in the last 72 hours. Fasting Lipid Panel: No results for input(s): CHOL, HDL, LDLCALC, TRIG, CHOLHDL, LDLDIRECT in the last 72 hours. Thyroid Function Tests: No results for input(s): TSH, T4TOTAL, FREET4, T3FREE, THYROIDAB in the last 72 hours. Anemia Panel: No results for input(s): VITAMINB12, FOLATE, FERRITIN, TIBC, IRON, RETICCTPCT in the last 72 hours. Coagulation:  Recent Labs  12/06/15 1156  LABPROT 16.9*  INR 1.36   Urine Drug Screen: Drugs of Abuse     Component Value Date/Time   LABOPIA NONE DETECTED 12/06/2015 1200   COCAINSCRNUR NONE DETECTED 12/06/2015 1200   LABBENZ NONE DETECTED 12/06/2015 1200   AMPHETMU POSITIVE* 12/06/2015 1200   THCU NONE DETECTED 12/06/2015 1200   LABBARB NONE DETECTED 12/06/2015 1200    Alcohol Level: No results for input(s): ETH in the last 72 hours. Urinalysis:  Recent Labs  12/06/15 1200  COLORURINE AMBER*  LABSPEC 1.014  PHURINE 5.5  GLUCOSEU NEGATIVE  HGBUR NEGATIVE  BILIRUBINUR SMALL*  KETONESUR NEGATIVE  PROTEINUR NEGATIVE  NITRITE NEGATIVE  LEUKOCYTESUR MODERATE*     Imaging results:  Dg Chest 2 View  12/06/2015  CLINICAL  DATA:  82 year old female with weakness and confusion. Initial encounter. EXAM: CHEST  2 VIEW COMPARISON:  02/23/2008. FINDINGS: Semi upright AP and lateral views of the chest. Mild to moderate cardiomegaly has increased since 2009. Other mediastinal contours remain within normal limits. Lower lung volumes. No pneumothorax, pulmonary edema, pleural effusion or consolidation. Patchy 11 mm lung nodule in the right lung projecting at the minor fissure. Osteopenia. Calcified aortic atherosclerosis. IMPRESSION: 1. Cardiomegaly has developed since 2009.  Lower lung volumes. 2. Right mid lung 11 mm pulmonary nodule is new, but might be inflammatory or due to scarring. In this setting, lower followup PA and lateral chest X-ray could be performed in 3 months to evaluate persistence. Electronically Signed   By: Odessa Fleming M.D.   On: 12/06/2015 11:38      Other results: EKG: AFib, possible old inferior infarct, PAC.  Assessment & Plan by Problem: 82 year old woman with PMH of chronic Afib here with increased confusion, weakness.  Acute encephalopathy:  Patient oriented to person and birthdate.  Does not know the date or that she is at [facility].  Unclear what her baseline is. She is able to communicate her needs and has complaint of sore throat and low back pain since her recent fall.  She has no abdominal pain or symptoms.  There is a hernia on exam but no evidence of SBO.  UA is positive for leukocytes and it is possible that her increased confusion is 2/2 to UTI.  Certainly, untreated/undertreated pain from her recent fall may be contributing to confusion as well. - admit for observation - IV normal saline - ceftriaxone for UTI - Tylenol, voltaren gel, ice and/or heat for pain - fall precautions  AKI on CKD:  Cr 1.76.  No other value in EPIC for comparison but she carries CKD diagnosis.   - IV fluids as above - avoid nephrotoxins - BMP in AM  Chronic atrial fibrillation:  Rate controlled w/o BB or CCB.   On coumadin for A/C but INR sub-therapeutic. - continue coumadin per pharmacy  Depression: -Continue home Sertraline 25 mg po daily  Diet:  Dysphagia 1 VTE ppx:  Coumadin  Code:  Full code confirmed with patient who appeared competent to make the decision for herself.  This is also consistent with Full code status listed on paperwork from her ALF.   Dispo: Disposition is deferred at this time, awaiting improvement of current medical problems. Anticipated discharge in approximately 1 day(s).   The patient does not have a current PCP (No primary care provider on file.) and does not need an [facility] hospital follow-up appointment after discharge.  The patient does not know have transportation limitations that hinder transportation to clinic appointments.  Signed: Darreld Mclean, MD 12/06/2015, 8:44 PM

## 2015-12-06 NOTE — ED Notes (Signed)
To ct

## 2015-12-06 NOTE — Progress Notes (Signed)
Pt arrived to 5M15 via stretcher.  VSS.   Will continue to monitor. Sondra Come, RN

## 2015-12-06 NOTE — ED Notes (Signed)
Son in room

## 2015-12-06 NOTE — ED Notes (Signed)
Pt arrives via ems for increasing weakness and confusion. Pt has been at [facility] x 1 month. Per staff pt is aao and ambulates with walker , this am found to be more confused and  weak , pt is warm to touch and she has knot on her abd around umbilical area that seems to be bigger per staff. Pt is in afib which she has hx of. Her son was contacted per  [facility] has 20 l AC

## 2015-12-06 NOTE — ED Provider Notes (Signed)
The patient is a 82 year old female with a history of chronic kidney disease, atrial fibrillation and anemia. She had a fall a couple of days ago, had a CT scan at that time showing no acute intracranial injury. she presents to the hospital with generalized weakness, there has been confusion, Mr. Ander Slade has attempted to discuss this with the nursing home staff was not answering the phone at this time. Messages were left. The patient is unable to give any history, she is sleepy appearing, confused, she does follow commands but does a lot of groaning, crying when she is examined. Her abdomen is soft but does have mild diffuse tenderness with a midline abdominal mass which is about the size of a golf ball. This does not appear to be easily reducible, after 5 minutes of constant pressure I was unable to get the mass reduced, she did cry out in pain when I palpated this part of her abdomen. She does not have any peritoneal signs, her heart rate is irregular but not tachycardic, she does not appear to be in respiratory distress but does have bilateral lower extremity pitting edema. We are unsure how much of this is her normal baseline and much is new, family is unreachable at this time.   EKG Interpretation  Date/Time:  Wednesday December 06 2015 10:46:28 EST Ventricular Rate:  103 PR Interval:  52 QRS Duration: 102 QT Interval:  347 QTC Calculation: 454 R Axis:   -29 Text Interpretation:  Atrial fibrillation Atrial premature complex Abnormal R-wave progression, late transition Inferior infarct, age indeterminate Baseline wander in lead(s) I V2 Since last tracing Atrial fibrillation now present. Confirmed by Hyacinth Meeker  MD, Eldana Isip ([id number]) on 12/06/2015 11:13:37 AM        Medical screening examination/treatment/procedure(s) were conducted as a shared visit with non-physician practitioner(s) and myself.  I personally evaluated the patient during the encounter.  Clinical Impression:   Final diagnoses:  Altered  mental status, unspecified altered mental status type  Hypokalemia  Anemia, unspecified anemia type  Acute cystitis without hematuria         Eber Hong, MD 12/06/15 [phone]

## 2015-12-06 NOTE — ED Notes (Signed)
Pt temp. Checked rectally 98.0

## 2015-12-06 NOTE — H&P (Signed)
Internal Medicine Attending Admission Note Date: 12/06/2015  Patient name: Mary Mcclain Medical record number: [id number] Date of birth: [birth year] Age: 82 y.o. Gender: female  I saw and evaluated the patient. I reviewed the resident's note and I agree with the resident's findings and plan as documented in the resident's note.  Chief Complaint(s): Generalized weakness and confusion.  History - key components related to admission:  Mary Mcclain is a 82 year old woman with a history of chronic atrial fibrillation, stage III chronic kidney disease, aortic atherosclerosis, nephrolithiasis, diverticulosis, osteoporosis, degenerative disc disease, and depression who presents to the emergency department from [facility]) in [place] purportedly with generalized weakness and confusion. By the time we had examined the patient her complaints centered around a sore throat and back pain after her fall 2 days ago. To our examination she was cooperative and responsive to questions. She also knew significant facts such as the name of her oldest son, where she is living, what year she was born, etc. which we were able to objectively confirm. Although she was confused on how she got to the emergency department and why she was in the emergency department there were several aspects of her history of present illness that she was very clear about. Family were unavailable to guide [place] as to whether or not this was her baseline. Chart review suggests she is intermittently confused but able to carry on a conversation. I would say that accurately describes our evaluation. She was admitted to the internal medicine teaching service for further evaluation and care. Other than the sore throat and back pain she was without acute complaints.   She is unable to provide [place] a family history at this time.  Physical Exam - key components related to admission:  Filed Vitals:   12/06/15 1545 12/06/15  1600 12/06/15 1647 12/06/15 1833  BP: 158/94 155/93 162/94 183/97  Pulse: 107 103 87 102  Temp:    97.5 F (36.4 C)  TempSrc:    Oral  Resp: 16 15 20 15   SpO2: 96% 96% 96% 98%   Gen.: Well-developed, well-nourished, elderly woman lying comfortably on the hospital stretcher in no acute distress. Her affect was blunted but she was cooperative to the examination. She appeared her stated age. HEENT: Normocephalic, atraumatic, anicteric, oropharynx with a grape-shaped uvula but no erythema or exudates appreciated. She did have upper dentures in. Neck: Supple without cervical adenopathy. She did have some tenderness over the anterior aspects of her neck. There was no crepitus or erythema.  Lungs: Clear to auscultation bilaterally without wheezes, rhonchi, or rales. Heart: Irregularly irregular, mildly tachycardic, without murmurs or rubs. Abdomen: Soft, nontender, hypoactive bowel sounds, without guarding or rebound. There was a supra umbilical abdominal wall hernia about the size of a half of a tennis ball that was nontender and nonreducible. Extremities: Marked pitting edema with thin skin of both lower extremities below the knee with some ecchymoses. Her shins were somewhat tender to palpation.  Lab results:  Basic Metabolic Panel:  Recent Labs  21/30/86 1156  NA 140  K 3.2*  CL 101  CO2 22  GLUCOSE 125*  BUN 38*  CREATININE 1.76*  CALCIUM 9.6   Liver Function Tests:  Recent Labs  12/06/15 1156  AST 17  ALT 10*  ALKPHOS 58  BILITOT 0.3  PROT 6.8  ALBUMIN 3.1*   CBC:  Recent Labs  12/06/15 1156  WBC 9.3  NEUTROABS 7.5  HGB 10.3*  HCT 35.1*  MCV  76.6*  PLT 296   Coagulation:  Recent Labs  12/06/15 1156  INR 1.36   Urine Drug Screen:  Positive for amphetamines. Negative for opiates, cocaine, benzodiazepines, THC, and barbiturates.  Urinalysis:  Amber, cloudy, specific gravity 1.014, pH 5.5, small bilirubin, negative nitrite, moderate leukocytes, too  numerous to count white blood cells, no red blood cells, many bacteria.  Misc. Labs:  Urine culture pending  Imaging results:  Ct Abdomen Pelvis Wo Contrast  12/06/2015  CLINICAL DATA:  Larey Seat several days ago. Low back and abdominal pain. Weakness and confusion. EXAM: CT ABDOMEN AND PELVIS WITHOUT CONTRAST TECHNIQUE: Multidetector CT imaging of the abdomen and pelvis was performed following the standard protocol without IV contrast. COMPARISON:  Lumbar radiography 12/03/2006 2017. FINDINGS: There is bronchiectasis in the left lower lobe with some atelectasis and possible mild patchy infiltrate. Right lung bases clear. No pleural fluid. There is a hiatal hernia The liver is normal except for a 1.9 cm cyst or hemangioma within the medial segment of the left lobe. Previous cholecystectomy. The spleen is normal. The pancreas is normal with some atrophic change. The adrenal glands are normal. The left kidney contains a 2 mm stone in the lower pole. There are a few small cysts. No hydronephrosis. The right kidney contains 2 mm stone in the midportion. Several cysts are present, the largest measuring 2.5 cm in the midportion. There is atherosclerotic change of the aorta but no aneurysm. The IVC is normal. No retroperitoneal mass or adenopathy. No free intraperitoneal fluid or air. No acute bowel pathology. There is a ventral hernia containing mesenteric fat. There is diverticulosis without evidence of diverticulitis. The bladder appears normal. There is a uterine mass most consistent with leiomyoma measuring about 4 x 5 cm. No adnexal lesion. There is curvature in chronic degenerative change of the spine. No evidence of spinal fracture or pelvic fracture. IMPRESSION: No acute finding by noncontrast CT. Bronchiectasis in the left lower lobe with some volume loss and possible mild patchy infiltrate. A hiatal hernia. Ventral abdominal hernia containing only fat. Atherosclerosis of the aorta and its branch vessels  without aneurysm. Liver cyst and renal cysts. Diverticulosis without evidence of diverticulitis. Electronically Signed   By: Paulina Fusi M.D.   On: 12/06/2015 16:30   Dg Chest 2 View  12/06/2015  CLINICAL DATA:  82 year old female with weakness and confusion. Initial encounter. EXAM: CHEST  2 VIEW COMPARISON:  02/23/2008. FINDINGS: Semi upright AP and lateral views of the chest. Mild to moderate cardiomegaly has increased since 2009. Other mediastinal contours remain within normal limits. Lower lung volumes. No pneumothorax, pulmonary edema, pleural effusion or consolidation. Patchy 11 mm lung nodule in the right lung projecting at the minor fissure. Osteopenia. Calcified aortic atherosclerosis. IMPRESSION: 1. Cardiomegaly has developed since 2009.  Lower lung volumes. 2. Right mid lung 11 mm pulmonary nodule is new, but might be inflammatory or due to scarring. In this setting, lower followup PA and lateral chest X-ray could be performed in 3 months to evaluate persistence. Electronically Signed   By: Odessa Fleming M.D.   On: 12/06/2015 11:38   Dg Lumbar Spine Complete  12/04/2015  CLINICAL DATA:  82 year old nursing home patient who had an unwitnessed fall around 4 o'clock p.m., complaining of severe low back pain. EXAM: LUMBAR SPINE - COMPLETE 4+ VIEW COMPARISON:  None. FINDINGS: Five non rib-bearing lumbar vertebrae with S1 representing a transitional segment, having a well defined disc space between it and S2. Anatomic alignment. Mild compression fracture  of the upper endplate of L1 on the order of 20% or so which does not appear acute. No fractures elsewhere. Moderate to severe degenerative disc disease and spondylosis at every lumbar level, worst at L2-3 and L4-5. No pars defects. Facet degenerative changes bilaterally at L4-5 and L5-S1. Sacroiliac joints intact. Degenerative changes involving the visualized hip joints. Aortoiliac atherosclerosis without aneurysm. IMPRESSION: 1. No acute osseous abnormality.  2. Old mild compression fracture of the upper endplate of L1 on the order of 20% or so. 3. Moderate to severe multilevel degenerative disc disease and spondylosis, worst at L2-3 and L4-5. Bilateral facet degenerative changes at L4-5 and L5-S1. Electronically Signed   By: Hulan Saas M.D.   On: 12/04/2015 20:57   Ct Head Wo Contrast  12/04/2015  CLINICAL DATA:  82 year old female post unwitnessed fall today. No loss of consciousness. EXAM: CT HEAD WITHOUT CONTRAST CT CERVICAL SPINE WITHOUT CONTRAST TECHNIQUE: Multidetector CT imaging of the head and cervical spine was performed following the standard protocol without intravenous contrast. Multiplanar CT image reconstructions of the cervical spine were also generated. COMPARISON:  None. FINDINGS: CT HEAD FINDINGS The generalized atrophy and moderate to advanced chronic small vessel ischemic change. No intracranial hemorrhage, mass effect, or midline shift. No hydrocephalus. The basilar cisterns are patent. No evidence of territorial infarct. No intracranial fluid collection. Calvarium is intact. Included paranasal sinuses and mastoid air cells are well aerated. CT CERVICAL SPINE FINDINGS There is no fracture. The dens is intact. There are no jumped or perched facets. Disc space narrowing from C4-C5 through C6-C7 with endplate spurring. Scattered facet arthropathy. No prevertebral soft tissue edema. IMPRESSION: 1. Atrophy and chronic small vessel ischemia, no acute intracranial abnormality. 2. Degenerative change in the cervical spine without acute fracture or subluxation. Electronically Signed   By: Rubye Oaks M.D.   On: 12/04/2015 21:15   Ct Cervical Spine Wo Contrast  12/04/2015  CLINICAL DATA:  82 year old female post unwitnessed fall today. No loss of consciousness. EXAM: CT HEAD WITHOUT CONTRAST CT CERVICAL SPINE WITHOUT CONTRAST TECHNIQUE: Multidetector CT imaging of the head and cervical spine was performed following the standard protocol  without intravenous contrast. Multiplanar CT image reconstructions of the cervical spine were also generated. COMPARISON:  None. FINDINGS: CT HEAD FINDINGS The generalized atrophy and moderate to advanced chronic small vessel ischemic change. No intracranial hemorrhage, mass effect, or midline shift. No hydrocephalus. The basilar cisterns are patent. No evidence of territorial infarct. No intracranial fluid collection. Calvarium is intact. Included paranasal sinuses and mastoid air cells are well aerated. CT CERVICAL SPINE FINDINGS There is no fracture. The dens is intact. There are no jumped or perched facets. Disc space narrowing from C4-C5 through C6-C7 with endplate spurring. Scattered facet arthropathy. No prevertebral soft tissue edema. IMPRESSION: 1. Atrophy and chronic small vessel ischemia, no acute intracranial abnormality. 2. Degenerative change in the cervical spine without acute fracture or subluxation. Electronically Signed   By: Rubye Oaks M.D.   On: 12/04/2015 21:15   Dg Hip Unilat With Pelvis 2-3 Views Right  12/04/2015  CLINICAL DATA:  Unwitnessed fall around 4 p.m. today. EXAM: DG HIP (WITH OR WITHOUT PELVIS) 2-3V RIGHT COMPARISON:  None. FINDINGS: Frontal pelvis shows no fracture. SI joints and symphysis pubis are unremarkable. AP and frog-leg lateral views of the right hip show no evidence for right femoral neck fracture. IMPRESSION: Negative. Electronically Signed   By: Kennith Center M.D.   On: 12/04/2015 20:56   Other results:  EKG: Atrial  fibrillation at 103 bpm, normal axis, normal intervals, Q-wave in III, no LVH by voltage, questionable lead reversal of V2 and V3, good R wave progression, no ST segment changes, T wave inversions in leads III and aVF, no comparisons immediately available.  Assessment & Plan by Problem:  Mary Mcclain is a 82 year old woman with a history of chronic atrial fibrillation, stage III chronic kidney disease, aortic atherosclerosis, nephrolithiasis,  diverticulosis, osteoporosis, degenerative disc disease, and depression who presents to the emergency department from [facility]) in [place] purportedly with generalized weakness and confusion. On our evaluation her mental status seemed to be at the described baseline and she was complaining of a sore throat and back pain 2 days after a fall. Evaluation of the throat failed to reveal any erythema or exudate and may have been secondary to dry mucous membranes. The back pain had been evaluated radiographically 2 days earlier in the emergency department. There were no fractures. A CT of the abdomen and pelvis also demonstrated no acute pathology of the back and spine. She did have a urinary tract infection so if her confusion was slightly worse than baseline it is likely related to the urinary tract infection with overlying pain and chronic depression.  1) Possible acute encephalopathy: We will ask the family to reassess her mental status to establish whether or not she is at baseline. In the meantime, we will treat her urinary tract infection pending ID and sensitivity of the bacteria. We will also continue her SSRI therapy and treat her back pain with tylenol and Voltaren gel given her underlying chronic kidney disease and our desire to avoid oral NSAID therapy. We will reassess her mental status in the morning.  2) Low back pain: Likely musculoskeletal after a fall 2 days ago. No fractures or other pathology have been found on exam or imaging. We will treat with tylenol and Voltaren gel in hopes of providing her with some relief.  3) Urinary tract infection: She received a dose of ceftriaxone and this will be continued until we have identification and sensitivities of her urinary tract bacteria.  4) Depression: We will continue with her SSRI therapy.  5) Disposition: If her mental status returns to baseline per family evaluation with the above interventions she can  be transferred back to her Senior assisted living facility in the next 24-48 hours.

## 2015-12-07 DIAGNOSIS — R4182 Altered mental status, unspecified: Secondary | ICD-10-CM

## 2015-12-07 DIAGNOSIS — N390 Urinary tract infection, site not specified: Secondary | ICD-10-CM | POA: Diagnosis not present

## 2015-12-07 DIAGNOSIS — N3000 Acute cystitis without hematuria: Secondary | ICD-10-CM | POA: Diagnosis not present

## 2015-12-07 DIAGNOSIS — B9689 Other specified bacterial agents as the cause of diseases classified elsewhere: Secondary | ICD-10-CM | POA: Diagnosis not present

## 2015-12-07 LAB — BASIC METABOLIC PANEL
ANION GAP: 12 (ref 5–15)
BUN: 32 mg/dL — ABNORMAL HIGH (ref 6–20)
CALCIUM: 8.3 mg/dL — AB (ref 8.9–10.3)
CHLORIDE: 107 mmol/L (ref 101–111)
CO2: 23 mmol/L (ref 22–32)
CREATININE: 1.36 mg/dL — AB (ref 0.44–1.00)
GFR calc Af Amer: 37 mL/min — ABNORMAL LOW (ref >60)
GFR calc non Af Amer: 32 mL/min — ABNORMAL LOW (ref >60)
GLUCOSE: 95 mg/dL (ref 65–99)
Potassium: 3 mmol/L — ABNORMAL LOW (ref 3.5–5.1)
Sodium: 142 mmol/L (ref 135–145)

## 2015-12-07 LAB — PROTIME-INR
INR: 1.86 — ABNORMAL HIGH (ref 0.00–1.49)
Prothrombin Time: 21.3 seconds — ABNORMAL HIGH (ref 11.6–15.2)

## 2015-12-07 LAB — MAGNESIUM: MAGNESIUM: 1.5 mg/dL — AB (ref 1.7–2.4)

## 2015-12-07 LAB — URINE CULTURE

## 2015-12-07 MED ORDER — CEPHALEXIN 250 MG PO CAPS
250.0000 mg | ORAL_CAPSULE | Freq: Three times a day (TID) | ORAL | Status: DC
  Administered 2015-12-07: 250 mg via ORAL
  Filled 2015-12-07: qty 1

## 2015-12-07 MED ORDER — POTASSIUM CHLORIDE CRYS ER 20 MEQ PO TBCR
40.0000 meq | EXTENDED_RELEASE_TABLET | Freq: Two times a day (BID) | ORAL | Status: DC
  Administered 2015-12-07: 40 meq via ORAL
  Filled 2015-12-07: qty 2

## 2015-12-07 MED ORDER — MAGNESIUM SULFATE 2 GM/50ML IV SOLN
2.0000 g | Freq: Once | INTRAVENOUS | Status: AC
  Administered 2015-12-07: 2 g via INTRAVENOUS
  Filled 2015-12-07: qty 50

## 2015-12-07 MED ORDER — CEPHALEXIN 250 MG PO CAPS: 250.0000 mg | ORAL_CAPSULE | Freq: Three times a day (TID) | ORAL | Status: DC

## 2015-12-07 MED ORDER — POLYETHYLENE GLYCOL 3350 17 G PO PACK
17.0000 g | PACK | Freq: Every day | ORAL | Status: DC
  Administered 2015-12-07: 17 g via ORAL
  Filled 2015-12-07: qty 1

## 2015-12-07 NOTE — Evaluation (Signed)
Physical Therapy Evaluation Patient Details Name: Mary Mcclain MRN: [id number] DOB: 24-Jan-1917 Today's Date: 12/07/2015   History of Present Illness  82 yo female with UTI was admitted from ALF with bronchietasis noted, UTI, weak and confused with recent fall.  PMHx:  L1 compression fracture  Clinical Impression  Pt is getting up to chair and able to assist with her breakfast, but too weak to sit or stand alone.  Her plan is to go to SNF with PT intervening until DC.  Her second plan if SNF cannot be done would to have HHPT in ALF providign staff could safely manage her.    Follow Up Recommendations SNF    Equipment Recommendations  Other (comment);None recommended by PT (no clear equipment information from pt)    Recommendations for Other Services Rehab consult     Precautions / Restrictions Precautions Precautions: Fall Restrictions Weight Bearing Restrictions: No      Mobility  Bed Mobility Overal bed mobility: Needs Assistance Bed Mobility: Supine to Sit     Supine to sit: Mod assist     General bed mobility comments: sequenced for pt  Transfers Overall transfer level: Needs assistance Equipment used: 1 person hand held assist (dense cues for sequence) Transfers: Sit to/from [employer] Sit to Stand: Mod assist Stand pivot transfers: Mod assist       General transfer comment: Pt can sidestep a little but not able to fully stand up  Ambulation/Gait             General Gait Details: unable  Stairs            Wheelchair Mobility    Modified Rankin (Stroke Patients Only)       Balance Overall balance assessment: Needs assistance Sitting-balance support: Feet supported Sitting balance-Leahy Scale: Poor     Standing balance support: Bilateral upper extremity supported Standing balance-Leahy Scale: Poor                               Pertinent Vitals/Pain Pain Assessment: Faces Faces Pain Scale: Hurts little  more Pain Location: low back Pain Descriptors / Indicators: Aching Pain Intervention(s): Monitored during session;Repositioned    Home Living Family/patient expects to be discharged to:: Assisted living               Home Equipment: Other (comment) (Pt cannot recall her PLOF)      Prior Function Level of Independence:  (Pt cannot recall)               Hand Dominance        Extremity/Trunk Assessment   Upper Extremity Assessment: Generalized weakness           Lower Extremity Assessment: Generalized weakness      Cervical / Trunk Assessment: Kyphotic  Communication   Communication: No difficulties  Cognition Arousal/Alertness: Lethargic Behavior During Therapy: Flat affect Overall Cognitive Status: No family/caregiver present to determine baseline cognitive functioning       Memory: Decreased recall of precautions;Decreased short-term memory              General Comments General comments (skin integrity, edema, etc.): Pt is lethargic today and not clear herself on PLOF, so cannot be sure of how dependent or not that she was.  Will need rehab to be able to functionally be safe in ALF again.    Exercises        Assessment/Plan    PT  Assessment Patient needs continued PT services  PT Diagnosis Generalized weakness   PT Problem List Decreased strength;Decreased range of motion;Decreased activity tolerance;Decreased balance;Decreased mobility;Decreased coordination;Decreased cognition;Decreased safety awareness;Cardiopulmonary status limiting activity  PT Treatment Interventions DME instruction;Gait training;Functional mobility training;Therapeutic activities;Therapeutic exercise;Balance training;Neuromuscular re-education;Cognitive remediation;Patient/family education   PT Goals (Current goals can be found in the Care Plan section) Acute Rehab PT Goals Patient Stated Goal: to sit OOB PT Goal Formulation: Patient unable to participate in goal  setting Time For Goal Achievement: 12/21/15 Potential to Achieve Goals: Fair    Frequency Min 2X/week   Barriers to discharge Other (comment) (will need 2 person assist in ALF) Pt is at SNF level of care now'    Co-evaluation               End of Session Equipment Utilized During Treatment: Oxygen Activity Tolerance: Patient limited by lethargy Patient left: in chair;with call bell/phone within reach;with chair alarm set Nurse Communication: Mobility status    Functional Assessment Tool Used: clinical judgment Functional Limitation: Mobility: Walking and moving around Mobility: Walking and Moving Around Current Status (Z6109): At least 60 percent but less than 80 percent impaired, limited or restricted Mobility: Walking and Moving Around Goal Status [phone]): At least 60 percent but less than 80 percent impaired, limited or restricted    Time: 0804-0828 PT Time Calculation (min) (ACUTE ONLY): 24 min   Charges:   PT Evaluation $PT Eval Moderate Complexity: 1 Procedure PT Treatments $Therapeutic Activity: 8-22 mins   PT G Codes:   PT G-Codes **NOT FOR INPATIENT CLASS** Functional Assessment Tool Used: clinical judgment Functional Limitation: Mobility: Walking and moving around Mobility: Walking and Moving Around Current Status (U9811): At least 60 percent but less than 80 percent impaired, limited or restricted Mobility: Walking and Moving Around Goal Status [phone]): At least 60 percent but less than 80 percent impaired, limited or restricted    Ivar Drape 12/07/2015, 1:26 PM   Samul Dada, PT MS Acute Rehab Dept. Number: [facility] [id number] and [facility] [phone]

## 2015-12-07 NOTE — Progress Notes (Signed)
Report given to Jarvis Morgan at [facility]. Per facility rn request, prescription faxed. PTAR called.

## 2015-12-07 NOTE — Progress Notes (Addendum)
   Subjective: Patient states she is doing "ok" this morning. She says her back pain is improved. During rounds she tells me she does not want [place] to call her grandson Donovon or son Stann Mainland, but would like [place] to contact her oldest son Onalee Hua. We do not have David's contact information so I spoke to [employer]. He tells me that patient does have intermittent confusion and baseline with short-term memory issues and can usually participate in conversation. This is consistent with our exam. Objective: Vital signs in last 24 hours: Filed Vitals:   12/07/15 0200 12/07/15 0500 12/07/15 0559 12/07/15 1035  BP: 173/88  159/78 153/72  Pulse: 107  97 102  Temp: 97.9 F (36.6 C)  98.2 F (36.8 C) 97.9 F (36.6 C)  TempSrc: Oral  Oral Oral  Resp: Weight:  170 lb 8 oz (77.338 kg)    SpO2: 96%  94% 98%   Weight change:   Intake/Output Summary (Last 24 hours) at 12/07/15 1128 Last data filed at 12/06/15 2026  Gross per 24 hour  Intake    123 ml  Output      0 ml  Net    123 ml   General: resting in bed Cardiac: irregularly irregular, no rubs, murmurs or gallops appreciated Pulm: clear to auscultation bilaterally, moving normal volumes of air, no wheezing, rales, rhonchi Abd: soft, nontender, nondistended, BS present Ext: edema bilaterally, not palpated due to pain Neuro: alert, labile mood, intermittent confusion, conversing well this morning  Assessment/Plan: Principal Problem:   UTI (urinary tract infection) Active Problems:   Acute encephalopathy   CKD (chronic kidney disease)   Chronic atrial fibrillation (HCC)   Acute cystitis without hematuria   Stage 3 chronic kidney disease due to arterionephrosclerosis   Major depression, chronic (HCC)   Aortic atherosclerosis (HCC)  UTI: Likely cause for patient's confusion with contribution from decreased fluid intake. Started IV Ceftriaxone on admission, will transition to oral antibiotics to complete treatment. As patient has sulfa  allergy and CKD with CrCl <30.0, will treat with Keflex and adjust based on urine culture results as necessary. Patient's current mental status seems consistent with her baseline after discussion with her grandson. She is stable for discharge today.  -Switch Ceftriaxone to oral Keflex 250 mg q8h for 7 days total course. -f/u Urine culture and adjust antibiotics as indicated   AKI on CKD: Cr 1.76 on admission, improved to 1.36 today. Likely secondary to dehydration, decreased oral intake. - Encourage PO fluid intake  Lower back pain: Patient with reported fall 2 days prior to admission. No fractures or pathology were seen on imaging or physical exam. Likely musculoskeletal in nature, patient reports improvement in pain with tylenol and voltaren gel.  Chronic atrial fibrillation: Rate controlled w/o BB or CCB. On coumadin for A/C but INR sub-therapeutic. INR 1.86 today compared to 1.36. - continue coumadin  Depression: -Continue home Sertraline 25 mg po daily   Dispo:  Anticipated discharge today to [facility]). Grandson agreeable with plan.   LOS: 1 day   Darreld Mclean, MD 12/07/2015, 11:28 AM

## 2015-12-07 NOTE — Discharge Summary (Signed)
Name: Mary Mcclain MRN: [id number] DOB: 1916/11/04 82 y.o. PCP: No primary care provider on file.  Date of Admission: 12/06/2015 10:35 AM Date of Discharge: 12/07/2015 Attending Physician: Doneen Poisson, MD  Discharge Diagnosis: 1. AMS 2/2 UTI Principal Problem:   UTI (urinary tract infection) Active Problems:   Acute encephalopathy   CKD (chronic kidney disease)   Chronic atrial fibrillation (HCC)   Acute cystitis without hematuria   Stage 3 chronic kidney disease due to arterionephrosclerosis   Major depression, chronic (HCC)   Aortic atherosclerosis (HCC)  Discharge Medications:   Medication List    TAKE these medications        acetaminophen 325 MG tablet  Commonly known as:  TYLENOL  Take 650 mg by mouth every 4 (four) hours as needed for mild pain.     cephALEXin 250 MG capsule  Commonly known as:  KEFLEX  Take 1 capsule (250 mg total) by mouth every 8 (eight) hours.     cholecalciferol 1000 units tablet  Commonly known as:  VITAMIN D  Take 1,000 Units by mouth daily.     feeding supplement (PRO-STAT SUGAR FREE 64) Liqd  Take 30 mLs by mouth 2 (two) times daily.     pantoprazole 40 MG tablet  Commonly known as:  PROTONIX  Take 40 mg by mouth daily.     sertraline 25 MG tablet  Commonly known as:  ZOLOFT  Take 25 mg by mouth daily.     warfarin 2.5 MG tablet  Commonly known as:  COUMADIN  Take 2.5 mg by mouth every Monday, Wednesday, and Friday.     warfarin 2 MG tablet  Commonly known as:  COUMADIN  Take 2 mg by mouth every Tuesday, Thursday, Saturday, and Sunday at 6 PM.        Disposition and follow-up:   [name] was discharged from [facility] in Stable condition.  At the hospital follow up visit please address:  1.  Completion of antibiotics, fluid resuscitation, mental status, therapeutic coumadin dosing, consider tylenol/voltaren gel for back pain  2.  Labs / imaging needed at time of follow-up: PT/INR  3.   Pending labs/ test needing follow-up: Urine Culture  Follow-up Appointments:   Discharge Instructions: Discharge Instructions    Call MD for:  persistant dizziness or light-headedness    Complete by:  As directed      Call MD for:  severe uncontrolled pain    Complete by:  As directed      Call MD for:  temperature >100.4    Complete by:  As directed      Diet - low sodium heart healthy    Complete by:  As directed      Increase activity slowly    Complete by:  As directed            Consultations:    Procedures Performed:  Ct Abdomen Pelvis Wo Contrast  12/06/2015  CLINICAL DATA:  Larey Seat several days ago. Low back and abdominal pain. Weakness and confusion. EXAM: CT ABDOMEN AND PELVIS WITHOUT CONTRAST TECHNIQUE: Multidetector CT imaging of the abdomen and pelvis was performed following the standard protocol without IV contrast. COMPARISON:  Lumbar radiography 12/03/2006 2017. FINDINGS: There is bronchiectasis in the left lower lobe with some atelectasis and possible mild patchy infiltrate. Right lung bases clear. No pleural fluid. There is a hiatal hernia The liver is normal except for a 1.9 cm cyst or hemangioma within the medial segment of the  left lobe. Previous cholecystectomy. The spleen is normal. The pancreas is normal with some atrophic change. The adrenal glands are normal. The left kidney contains a 2 mm stone in the lower pole. There are a few small cysts. No hydronephrosis. The right kidney contains 2 mm stone in the midportion. Several cysts are present, the largest measuring 2.5 cm in the midportion. There is atherosclerotic change of the aorta but no aneurysm. The IVC is normal. No retroperitoneal mass or adenopathy. No free intraperitoneal fluid or air. No acute bowel pathology. There is a ventral hernia containing mesenteric fat. There is diverticulosis without evidence of diverticulitis. The bladder appears normal. There is a uterine mass most consistent with leiomyoma  measuring about 4 x 5 cm. No adnexal lesion. There is curvature in chronic degenerative change of the spine. No evidence of spinal fracture or pelvic fracture. IMPRESSION: No acute finding by noncontrast CT. Bronchiectasis in the left lower lobe with some volume loss and possible mild patchy infiltrate. A hiatal hernia. Ventral abdominal hernia containing only fat. Atherosclerosis of the aorta and its branch vessels without aneurysm. Liver cyst and renal cysts. Diverticulosis without evidence of diverticulitis. Electronically Signed   By: Paulina Fusi M.D.   On: 12/06/2015 16:30   Dg Chest 2 View  12/06/2015  CLINICAL DATA:  82 year old female with weakness and confusion. Initial encounter. EXAM: CHEST  2 VIEW COMPARISON:  02/23/2008. FINDINGS: Semi upright AP and lateral views of the chest. Mild to moderate cardiomegaly has increased since 2009. Other mediastinal contours remain within normal limits. Lower lung volumes. No pneumothorax, pulmonary edema, pleural effusion or consolidation. Patchy 11 mm lung nodule in the right lung projecting at the minor fissure. Osteopenia. Calcified aortic atherosclerosis. IMPRESSION: 1. Cardiomegaly has developed since 2009.  Lower lung volumes. 2. Right mid lung 11 mm pulmonary nodule is new, but might be inflammatory or due to scarring. In this setting, lower followup PA and lateral chest X-ray could be performed in 3 months to evaluate persistence. Electronically Signed   By: Odessa Fleming M.D.   On: 12/06/2015 11:38   Dg Lumbar Spine Complete  12/04/2015  CLINICAL DATA:  82 year old nursing home patient who had an unwitnessed fall around 4 o'clock p.m., complaining of severe low back pain. EXAM: LUMBAR SPINE - COMPLETE 4+ VIEW COMPARISON:  None. FINDINGS: Five non rib-bearing lumbar vertebrae with S1 representing a transitional segment, having a well defined disc space between it and S2. Anatomic alignment. Mild compression fracture of the upper endplate of L1 on the order of  20% or so which does not appear acute. No fractures elsewhere. Moderate to severe degenerative disc disease and spondylosis at every lumbar level, worst at L2-3 and L4-5. No pars defects. Facet degenerative changes bilaterally at L4-5 and L5-S1. Sacroiliac joints intact. Degenerative changes involving the visualized hip joints. Aortoiliac atherosclerosis without aneurysm. IMPRESSION: 1. No acute osseous abnormality. 2. Old mild compression fracture of the upper endplate of L1 on the order of 20% or so. 3. Moderate to severe multilevel degenerative disc disease and spondylosis, worst at L2-3 and L4-5. Bilateral facet degenerative changes at L4-5 and L5-S1. Electronically Signed   By: Hulan Saas M.D.   On: 12/04/2015 20:57   Ct Head Wo Contrast  12/04/2015  CLINICAL DATA:  82 year old female post unwitnessed fall today. No loss of consciousness. EXAM: CT HEAD WITHOUT CONTRAST CT CERVICAL SPINE WITHOUT CONTRAST TECHNIQUE: Multidetector CT imaging of the head and cervical spine was performed following the standard protocol without intravenous  contrast. Multiplanar CT image reconstructions of the cervical spine were also generated. COMPARISON:  None. FINDINGS: CT HEAD FINDINGS The generalized atrophy and moderate to advanced chronic small vessel ischemic change. No intracranial hemorrhage, mass effect, or midline shift. No hydrocephalus. The basilar cisterns are patent. No evidence of territorial infarct. No intracranial fluid collection. Calvarium is intact. Included paranasal sinuses and mastoid air cells are well aerated. CT CERVICAL SPINE FINDINGS There is no fracture. The dens is intact. There are no jumped or perched facets. Disc space narrowing from C4-C5 through C6-C7 with endplate spurring. Scattered facet arthropathy. No prevertebral soft tissue edema. IMPRESSION: 1. Atrophy and chronic small vessel ischemia, no acute intracranial abnormality. 2. Degenerative change in the cervical spine without acute  fracture or subluxation. Electronically Signed   By: Rubye Oaks M.D.   On: 12/04/2015 21:15   Ct Cervical Spine Wo Contrast  12/04/2015  CLINICAL DATA:  82 year old female post unwitnessed fall today. No loss of consciousness. EXAM: CT HEAD WITHOUT CONTRAST CT CERVICAL SPINE WITHOUT CONTRAST TECHNIQUE: Multidetector CT imaging of the head and cervical spine was performed following the standard protocol without intravenous contrast. Multiplanar CT image reconstructions of the cervical spine were also generated. COMPARISON:  None. FINDINGS: CT HEAD FINDINGS The generalized atrophy and moderate to advanced chronic small vessel ischemic change. No intracranial hemorrhage, mass effect, or midline shift. No hydrocephalus. The basilar cisterns are patent. No evidence of territorial infarct. No intracranial fluid collection. Calvarium is intact. Included paranasal sinuses and mastoid air cells are well aerated. CT CERVICAL SPINE FINDINGS There is no fracture. The dens is intact. There are no jumped or perched facets. Disc space narrowing from C4-C5 through C6-C7 with endplate spurring. Scattered facet arthropathy. No prevertebral soft tissue edema. IMPRESSION: 1. Atrophy and chronic small vessel ischemia, no acute intracranial abnormality. 2. Degenerative change in the cervical spine without acute fracture or subluxation. Electronically Signed   By: Rubye Oaks M.D.   On: 12/04/2015 21:15   Dg Hip Unilat With Pelvis 2-3 Views Right  12/04/2015  CLINICAL DATA:  Unwitnessed fall around 4 p.m. today. EXAM: DG HIP (WITH OR WITHOUT PELVIS) 2-3V RIGHT COMPARISON:  None. FINDINGS: Frontal pelvis shows no fracture. SI joints and symphysis pubis are unremarkable. AP and frog-leg lateral views of the right hip show no evidence for right femoral neck fracture. IMPRESSION: Negative. Electronically Signed   By: Kennith Center M.D.   On: 12/04/2015 20:56    2D Echo: n/a  Cardiac Cath: n/a  Admission HPI: Ms. Adam Demary is a 82 year old female with PMH of Atrial fibrillation on coumadin, CKD stage 3, and depression who presented from [facility]) for reported confusion. When seen in the ED, patient's main complaint was for sore throat and difficulty swallowing. She also complains of back pain due to a recent fall 2 days ago. She is able to tell me her name, her grandson and oldest son's names, that she lives in [place], and her year of birth. She does repeatedly ask where she is and when told, she wonders why she is here. She denies any chest pain, burning on urination, urgency, frequency, or chest pain. She says she has not had a bowel movement recently. Otherwise review of systems is limited. Patient's family was not present during examination to provide additional history, however on chart review, patient apparently has a baseline intermittent confusion with the usual ability to carry conversations.   Hospital Course by problem list: Principal  Problem:   UTI (urinary tract infection) Active Problems:   Acute encephalopathy   CKD (chronic kidney disease)   Chronic atrial fibrillation (HCC)   Acute cystitis without hematuria   Stage 3 chronic kidney disease due to arterionephrosclerosis   Major depression, chronic (HCC)   Aortic atherosclerosis (HCC)   UTI: Patient presented to ED from ALF with altered mental status changed from baseline of intermittent confusion, short-term memory deficit, and ability to converse fairly well. UA was positive for leukocytes, negative nitrites. She was started on IV Ceftriaxone for treatment. When seen the afternoon after presentation and the following morning, patient appeared at her baseline, which was consistent with description provided by grandson. She was discharged with oral Keflex 250 mg q8h to complete a total of 7 days course antibiotics.  AKI on CKD: SCr was 1.76 on admission without prior to compare. Thought secondary to  dehydration and decreased oral intake. Patient given normal saline IVF resuscitation with improvement in SCr to 1.36. Patient also with hypokalemia and hypomagnesemia which were supplemented.  Lower back pain: Patient with reported fall 2 days prior to admission. No fractures or pathology were seen on imaging or physical exam. Thought musculoskeletal in nature, patient reported improvement in pain with tylenol and voltaren gel.  Chronic atrial fibrillation: Rate controlled w/o BB or CCB. Continued home Coumadin for A/C but INR was sub-therapeutic at 1.36. INR on discharge was 1.86.  Depression: Continued home Sertraline 25 mg po daily.   Discharge Vitals:   BP 153/72 mmHg  Pulse 102  Temp(Src) 97.9 F (36.6 C) (Oral)  Resp 18  Wt 170 lb 8 oz (77.338 kg)  SpO2 98%  Discharge Labs:  Results for orders placed or performed during the hospital encounter of 12/06/15 (from the past 24 hour(s))  Basic metabolic panel     Status: Abnormal   Collection Time: 12/07/15  5:32 AM  Result Value Ref Range   Sodium 142 135 - 145 mmol/L   Potassium 3.0 (L) 3.5 - 5.1 mmol/L   Chloride 107 101 - 111 mmol/L   CO2 23 22 - 32 mmol/L   Glucose, Bld 95 65 - 99 mg/dL   BUN 32 (H) 6 - 20 mg/dL   Creatinine, Ser 1.61 (H) 0.44 - 1.00 mg/dL   Calcium 8.3 (L) 8.9 - 10.3 mg/dL   GFR calc non Af Amer 32 (L) >60 mL/min   GFR calc Af Amer 37 (L) >60 mL/min   Anion gap 12 5 - 15  Protime-INR     Status: Abnormal   Collection Time: 12/07/15  5:32 AM  Result Value Ref Range   Prothrombin Time 21.3 (H) 11.6 - 15.2 seconds   INR 1.86 (H) 0.00 - 1.49  Magnesium     Status: Abnormal   Collection Time: 12/07/15  5:32 AM  Result Value Ref Range   Magnesium 1.5 (L) 1.7 - 2.4 mg/dL    Signed: Darreld Mclean, MD 12/07/2015, 12:00 PM    Services Ordered on Discharge: none Equipment Ordered on Discharge: none

## 2015-12-07 NOTE — Progress Notes (Signed)
Internal Medicine Attending  Date: 12/07/2015  Patient name: Mary Mcclain Medical record number: [id number] Date of birth: May 05, 1917 Age: 82 y.o. Gender: female  I saw and evaluated the patient. I reviewed the resident's note by Dr. Allena Katz and I agree with the resident's findings and plans as documented in his progress note.  [name] was seen on rounds this morning and was alert and appropriately answering questions. We were able to easily have a conversation with her. She states that both her sore throat and her back pain are improved since admission. We will treat her urinary tract infection with oral Keflex. We will follow-up on the culture and sensitivities and adjust as required. I agree she is safe for return back to her assisted living facility today and this will be arranged.

## 2015-12-07 NOTE — Progress Notes (Signed)
rn assisted pt in calling her grandson donovan.

## 2015-12-07 NOTE — Clinical Social Work Note (Cosign Needed)
Patient is from [facility]. BSW intern to arrange transport via [facility]. Patient's grandson, Domenic Schwab has been made aware of patient's discharge disposition.   BSW intern is signing off. If any further Social Work needs arises, please re-consult.  Orson Gear- BSW intern [phone]

## 2015-12-07 NOTE — Progress Notes (Signed)
Pt to be discharged, will first have magnesium infusion over 2 hours.

## 2015-12-07 NOTE — Progress Notes (Signed)
[facility] arrived, pt being transported back to facility.

## 2015-12-07 NOTE — Evaluation (Signed)
Occupational Therapy Evaluation Patient Details Name: Mary Mcclain MRN: [id number] DOB: [birth year] Today's Date: 12/07/2015    History of Present Illness 82 yo female with UTI was admitted from ALF with bronchietasis noted, UTI, weak and confused with recent fall.  PMHx:  L1 compression fracture   Clinical Impression   OT eval limited by fatigue and lethargy; pt declining OOB activities at this time. Pt unable to recall PLOF at this time. Pt currently mod assist for bed mobility and max assist for ADLs. Recommending SNF for further rehab prior to returning to ALF. Pt would benefit from continued skilled OT to address established goals.     Follow Up Recommendations  SNF;Supervision/Assistance - 24 hour    Equipment Recommendations  Other (comment) (TBD)    Recommendations for Other Services       Precautions / Restrictions Precautions Precautions: Fall Restrictions Weight Bearing Restrictions: No      Mobility Bed Mobility Overal bed mobility: Needs Assistance Bed Mobility: Rolling Rolling: Mod assist   Supine to sit: Mod assist     General bed mobility comments: VCs for sequencing and hand placement. Use of bed rail.  Transfers Overall transfer level: Needs assistance Equipment used: 1 person hand held assist (dense cues for sequence) Transfers: Sit to/from [employer] Sit to Stand: Mod assist Stand pivot transfers: Mod assist       General transfer comment: Pt declined OOB activities at this time.    Balance Overall balance assessment: Needs assistance Sitting-balance support: Feet supported Sitting balance-Leahy Scale: Poor     Standing balance support: Bilateral upper extremity supported Standing balance-Leahy Scale: Poor                              ADL Overall ADL's : Needs assistance/impaired                                       General ADL Comments: Pt oriented to person and place at this time  with inconsistent responses to questions. Pt currenlty max assist for ADLs. Pt declining OOB activities at this time but per PT eval; pt mod assist for sit <> stand and stand pivot transfers.     Vision     Perception     Praxis      Pertinent Vitals/Pain Pain Assessment: No/denies pain Faces Pain Scale: Hurts little more Pain Location: low back Pain Descriptors / Indicators: Aching Pain Intervention(s): Monitored during session;Repositioned     Hand Dominance Right   Extremity/Trunk Assessment Upper Extremity Assessment Upper Extremity Assessment: Generalized weakness   Lower Extremity Assessment Lower Extremity Assessment: Defer to PT evaluation   Cervical / Trunk Assessment Cervical / Trunk Assessment: Kyphotic   Communication Communication Communication: No difficulties   Cognition Arousal/Alertness: Lethargic Behavior During Therapy: Flat affect Overall Cognitive Status: No family/caregiver present to determine baseline cognitive functioning       Memory: Decreased recall of precautions;Decreased short-term memory             General Comments       Exercises Exercises: Other exercises (3 to 3+ LE strength noted)     Shoulder Instructions      Home Living Family/patient expects to be discharged to:: Assisted living  Home Equipment: Other (comment) (Pt cannot recall her PLOF)          Prior Functioning/Environment Level of Independence:  (Pt cannot recall; inconsistent answers)             OT Diagnosis: Generalized weakness;Cognitive deficits;Acute pain;Altered mental status   OT Problem List: Decreased strength;Decreased activity tolerance;Impaired balance (sitting and/or standing);Decreased cognition;Decreased safety awareness;Decreased knowledge of use of DME or AE;Decreased knowledge of precautions;Pain   OT Treatment/Interventions: Self-care/ADL training;Therapeutic exercise;Energy  conservation;DME and/or AE instruction;Therapeutic activities;Cognitive remediation/compensation;Patient/family education;Balance training    OT Goals(Current goals can be found in the care plan section) Acute Rehab OT Goals Patient Stated Goal: none stated OT Goal Formulation: With patient Time For Goal Achievement: 12/21/15 Potential to Achieve Goals: Fair ADL Goals Pt Will Perform Grooming: with min assist;standing Pt Will Perform Upper Body Bathing: with min guard assist;sitting Pt Will Perform Lower Body Bathing: with min assist;sit to/from stand Pt Will Transfer to Toilet: with min assist;stand pivot transfer;bedside commode Pt Will Perform Toileting - Clothing Manipulation and hygiene: with min assist;sit to/from stand  OT Frequency: Min 2X/week   Barriers to D/C:            Co-evaluation              End of Session    Activity Tolerance: Patient limited by lethargy;Patient limited by fatigue Patient left: in bed;with call bell/phone within reach;with bed alarm set;with family/visitor present   Time: 1610-9604 OT Time Calculation (min): 11 min Charges:  OT General Charges $OT Visit: 1 Procedure OT Evaluation $OT Eval Moderate Complexity: 1 Procedure G-Codes: OT G-codes **NOT FOR INPATIENT CLASS** Functional Assessment Tool Used: Clinical judgement Functional Limitation: Self care Self Care Current Status (V4098): At least 40 percent but less than 60 percent impaired, limited or restricted Self Care Goal Status (J1914): At least 20 percent but less than 40 percent impaired, limited or restricted   Gaye Alken M.S., OTR/L Pager: [phone]  12/07/2015, 2:06 PM

## 2015-12-07 NOTE — Progress Notes (Signed)
Waiting for facility nurse to call back to give report.

## 2015-12-07 NOTE — Care Management Obs Status (Signed)
MEDICARE OBSERVATION STATUS NOTIFICATION   Patient Details  Name: Mary Mcclain MRN: [id number] Date of Birth: January 04, 1917   Medicare Observation Status Notification Given:  Yes    Kermit Balo, RN 12/07/2015, 2:47 PM

## 2015-12-07 NOTE — [place] FL2 (Signed)
[place] MEDICAID FL2 LEVEL OF CARE SCREENING TOOL     IDENTIFICATION  Patient Name: Mary Mcclain Birthdate: 06-Mar-1917 Sex: female Admission Date (Current Location): 12/06/2015  [place] and [place] Number:  Haynes Bast [id number] D (TRICARE/TRICARE FOR LIFE- [id number]) Facility and Address:  [facility] [place], [address] [place], [place] [address]      Provider Number: [id number]  Attending Physician Name and Address:  Doneen Poisson, MD  Relative Name and Phone Number:   Domenic Schwab -son, [phone])    Current Level of Care: Hospital Recommended Level of Care: Assisted Living Facility Prior Approval Number:    Date Approved/Denied:   PASRR Number:    Discharge Plan: Other (Comment)    Current Diagnoses: Patient Active Problem List   Diagnosis Date Noted  . Acute encephalopathy 12/06/2015  . UTI (urinary tract infection) 12/06/2015  . CKD (chronic kidney disease) 12/06/2015  . Chronic atrial fibrillation (HCC) 12/06/2015  . Acute cystitis without hematuria   . Stage 3 chronic kidney disease due to arterionephrosclerosis   . Major depression, chronic (HCC)   . Aortic atherosclerosis (HCC)     Orientation RESPIRATION BLADDER Height & Weight     Self  Normal Continent Weight: 170 lb 8 oz (77.338 kg) Height:     BEHAVIORAL SYMPTOMS/MOOD NEUROLOGICAL BOWEL NUTRITION STATUS      Continent Diet (LOW SODIUM)  AMBULATORY STATUS COMMUNICATION OF NEEDS Skin   Extensive Assist   Normal                       Personal Care Assistance Level of Assistance  Total care       Total Care Assistance: Maximum assistance   Functional Limitations Info             SPECIAL CARE FACTORS FREQUENCY  PT (By licensed PT), OT (By licensed OT)     PT Frequency:  (2x/week) OT Frequency:  (2x/week)            Contractures      Additional Factors Info  Code Status, Allergies Code Status Info:  (FULL) Allergies Info:  (sulfa antibiotics)            Current Medications (12/07/2015):  This is the current hospital active medication list Current Facility-Administered Medications  Medication Dose Route Frequency Provider Last Rate Last Dose  . acetaminophen (TYLENOL) tablet 650 mg  650 mg Oral Q6H PRN Yolanda Manges, DO   650 mg at 12/06/15 2223  . cephALEXin (KEFLEX) capsule 250 mg  250 mg Oral 3 times per day Darreld Mclean, MD   250 mg at 12/07/15 1326  . cholecalciferol (VITAMIN D) tablet 1,000 Units  1,000 Units Oral Daily Yolanda Manges, DO   1,000 Units at 12/07/15 1014  . diclofenac sodium (VOLTAREN) 1 % transdermal gel 2 g  2 g Topical QID PRN Yolanda Manges, DO   2 g at 12/07/15 2841  . feeding supplement (PRO-STAT SUGAR FREE 64) liquid 30 mL  30 mL Oral BID Yolanda Manges, DO   30 mL at 12/07/15 1014  . pantoprazole (PROTONIX) EC tablet 40 mg  40 mg Oral Daily Yolanda Manges, DO   40 mg at 12/07/15 1013  . polyethylene glycol (MIRALAX / GLYCOLAX) packet 17 g  17 g Oral Daily Jana Half, MD   17 g at 12/07/15 1014  . potassium chloride SA (K-DUR,KLOR-CON) CR tablet 40 mEq  40 mEq Oral BID Jana Half, MD  40 mEq at 12/07/15 1014  . sertraline (ZOLOFT) tablet 25 mg  25 mg Oral Daily Yolanda Manges, DO   25 mg at 12/07/15 1014  . sodium chloride flush (NS) 0.9 % injection 3 mL  3 mL Intravenous Q12H Yolanda Manges, DO   3 mL at 12/07/15 1015  . Warfarin - Pharmacist Dosing Inpatient   Does not apply q1800 Emi Holes, [facility]         Discharge Medications: Please see discharge summary for a list of discharge medications.  Relevant Imaging Results:  Relevant Lab Results:   Additional Information  (SSN: [id number])  Orson Gear, Student-SW [phone]

## 2015-12-07 NOTE — Care Management Note (Signed)
Case Management Note  Patient Details  Name: Mary Mcclain MRN: [id number] Date of Birth: [birth year]  Subjective/Objective:                    Action/Plan: Patient discharging back to [facility] today. No further needs per CM.  Expected Discharge Date:                  Expected Discharge Plan:     In-House Referral:     Discharge planning Services     Post Acute Care Choice:    Choice offered to:     DME Arranged:    DME Agency:     HH Arranged:    HH Agency:     Status of Service:     Medicare Important Message Given:    Date Medicare IM Given:    Medicare IM give by:    Date Additional Medicare IM Given:    Additional Medicare Important Message give by:     If discussed at Long Length of Stay Meetings, dates discussed:    Additional Comments:  Kermit Balo, RN 12/07/2015, 2:52 PM

## 2015-12-07 NOTE — Discharge Instructions (Signed)
Please complete antibiotics as prescribed. Please continue to hydrate with fluids to avoid dehydration.  Urinary Tract Infection A urinary tract infection (UTI) can occur any place along the urinary tract. The tract includes the kidneys, ureters, bladder, and urethra. A type of germ called bacteria often causes a UTI. UTIs are often helped with antibiotic medicine.  HOME CARE   If given, take antibiotics as told by your doctor. Finish them even if you start to feel better.  Drink enough fluids to keep your pee (urine) clear or pale yellow.  Avoid tea, drinks with caffeine, and bubbly (carbonated) drinks.  Pee often. Avoid holding your pee in for a long time.  Pee before and after having sex (intercourse).  Wipe from front to back after you poop (bowel movement) if you are a woman. Use each tissue only once. GET HELP RIGHT AWAY IF:   You have back pain.  You have lower belly (abdominal) pain.  You have chills.  You feel sick to your stomach (nauseous).  You throw up (vomit).  Your burning or discomfort with peeing does not go away.  You have a fever.  Your symptoms are not better in 3 days. MAKE SURE YOU:   Understand these instructions.  Will watch your condition.  Will get help right away if you are not doing well or get worse.   This information is not intended to replace advice given to you by your health care provider. Make sure you discuss any questions you have with your health care provider.   Document Released: 03/18/2008 Document Revised: 10/21/2014 Document Reviewed: 04/30/2012 Elsevier Interactive Patient Education [employer]

## 2015-12-29 ENCOUNTER — Encounter (HOSPITAL_COMMUNITY): Payer: Self-pay | Admitting: Emergency Medicine

## 2015-12-29 ENCOUNTER — Emergency Department (HOSPITAL_COMMUNITY): Payer: Medicare Other

## 2015-12-29 ENCOUNTER — Inpatient Hospital Stay (HOSPITAL_COMMUNITY)
Admission: EM | Admit: 2015-12-29 | Discharge: 2016-01-02 | DRG: 194 | Disposition: A | Discharge status: Nursing Home (Includes ICF) | Payer: Medicare Other | Source: Skilled Nursing Facility | Attending: Student in an Organized Health Care Education/Training Program | Admitting: Student in an Organized Health Care Education/Training Program

## 2015-12-29 DIAGNOSIS — J189 Pneumonia, unspecified organism: Secondary | ICD-10-CM | POA: Diagnosis not present

## 2015-12-29 DIAGNOSIS — G4700 Insomnia, unspecified: Secondary | ICD-10-CM | POA: Diagnosis not present

## 2015-12-29 DIAGNOSIS — Z515 Encounter for palliative care: Secondary | ICD-10-CM | POA: Insufficient documentation

## 2015-12-29 DIAGNOSIS — I482 Chronic atrial fibrillation: Secondary | ICD-10-CM | POA: Diagnosis present

## 2015-12-29 DIAGNOSIS — E860 Dehydration: Secondary | ICD-10-CM | POA: Diagnosis present

## 2015-12-29 DIAGNOSIS — Y95 Nosocomial condition: Secondary | ICD-10-CM | POA: Diagnosis present

## 2015-12-29 DIAGNOSIS — R296 Repeated falls: Secondary | ICD-10-CM | POA: Diagnosis present

## 2015-12-29 DIAGNOSIS — N390 Urinary tract infection, site not specified: Secondary | ICD-10-CM | POA: Diagnosis present

## 2015-12-29 DIAGNOSIS — K219 Gastro-esophageal reflux disease without esophagitis: Secondary | ICD-10-CM | POA: Insufficient documentation

## 2015-12-29 DIAGNOSIS — L8990 Pressure ulcer of unspecified site, unspecified stage: Secondary | ICD-10-CM | POA: Insufficient documentation

## 2015-12-29 DIAGNOSIS — Z79899 Other long term (current) drug therapy: Secondary | ICD-10-CM

## 2015-12-29 DIAGNOSIS — Z7901 Long term (current) use of anticoagulants: Secondary | ICD-10-CM

## 2015-12-29 DIAGNOSIS — F0390 Unspecified dementia without behavioral disturbance: Secondary | ICD-10-CM | POA: Diagnosis present

## 2015-12-29 DIAGNOSIS — F329 Major depressive disorder, single episode, unspecified: Secondary | ICD-10-CM | POA: Diagnosis present

## 2015-12-29 DIAGNOSIS — N179 Acute kidney failure, unspecified: Secondary | ICD-10-CM

## 2015-12-29 DIAGNOSIS — Z66 Do not resuscitate: Secondary | ICD-10-CM | POA: Diagnosis present

## 2015-12-29 DIAGNOSIS — Z6824 Body mass index (BMI) 24.0-24.9, adult: Secondary | ICD-10-CM

## 2015-12-29 DIAGNOSIS — J9811 Atelectasis: Secondary | ICD-10-CM | POA: Diagnosis present

## 2015-12-29 DIAGNOSIS — Z7189 Other specified counseling: Secondary | ICD-10-CM | POA: Insufficient documentation

## 2015-12-29 DIAGNOSIS — E876 Hypokalemia: Secondary | ICD-10-CM | POA: Diagnosis present

## 2015-12-29 DIAGNOSIS — E46 Unspecified protein-calorie malnutrition: Secondary | ICD-10-CM | POA: Diagnosis present

## 2015-12-29 DIAGNOSIS — R627 Adult failure to thrive: Secondary | ICD-10-CM | POA: Diagnosis present

## 2015-12-29 DIAGNOSIS — N183 Chronic kidney disease, stage 3 (moderate): Secondary | ICD-10-CM | POA: Diagnosis present

## 2015-12-29 DIAGNOSIS — R45851 Suicidal ideations: Secondary | ICD-10-CM | POA: Diagnosis present

## 2015-12-29 DIAGNOSIS — Z87440 Personal history of urinary (tract) infections: Secondary | ICD-10-CM

## 2015-12-29 LAB — AMMONIA: Ammonia: 25 umol/L (ref 9–35)

## 2015-12-29 LAB — BASIC METABOLIC PANEL
Anion gap: 22 — ABNORMAL HIGH (ref 5–15)
BUN: 23 mg/dL — ABNORMAL HIGH (ref 6–20)
CALCIUM: 9.6 mg/dL (ref 8.9–10.3)
CHLORIDE: 97 mmol/L — AB (ref 101–111)
CO2: 21 mmol/L — ABNORMAL LOW (ref 22–32)
Creatinine, Ser: 2.18 mg/dL — ABNORMAL HIGH (ref 0.44–1.00)
GFR calc Af Amer: 21 mL/min — ABNORMAL LOW (ref >60)
GFR, EST NON AFRICAN AMERICAN: 18 mL/min — AB (ref >60)
Glucose, Bld: 108 mg/dL — ABNORMAL HIGH (ref 65–99)
Potassium: 3.5 mmol/L (ref 3.5–5.1)
SODIUM: 140 mmol/L (ref 135–145)

## 2015-12-29 LAB — URINE MICROSCOPIC-ADD ON

## 2015-12-29 LAB — TSH: TSH: 2.582 u[IU]/mL (ref 0.350–4.500)

## 2015-12-29 LAB — LIPASE, BLOOD: LIPASE: 25 U/L (ref 11–51)

## 2015-12-29 LAB — URINALYSIS, ROUTINE W REFLEX MICROSCOPIC
Glucose, UA: NEGATIVE mg/dL
Ketones, ur: 15 mg/dL — AB
NITRITE: NEGATIVE
PROTEIN: 30 mg/dL — AB
SPECIFIC GRAVITY, URINE: 1.016 (ref 1.005–1.030)
pH: 5.5 (ref 5.0–8.0)

## 2015-12-29 LAB — CBC
HCT: 39 % (ref 36.0–46.0)
HEMOGLOBIN: 11.6 g/dL — AB (ref 12.0–15.0)
MCH: 22.1 pg — ABNORMAL LOW (ref 26.0–34.0)
MCHC: 29.7 g/dL — ABNORMAL LOW (ref 30.0–36.0)
MCV: 74.3 fL — ABNORMAL LOW (ref 78.0–100.0)
Platelets: 224 10*3/uL (ref 150–400)
RBC: 5.25 MIL/uL — AB (ref 3.87–5.11)
RDW: 19.9 % — ABNORMAL HIGH (ref 11.5–15.5)
WBC: 11.1 10*3/uL — ABNORMAL HIGH (ref 4.0–10.5)

## 2015-12-29 LAB — PROTIME-INR
INR: 3.55 — ABNORMAL HIGH (ref 0.00–1.49)
Prothrombin Time: 34.8 seconds — ABNORMAL HIGH (ref 11.6–15.2)

## 2015-12-29 LAB — PROCALCITONIN: PROCALCITONIN: 1.17 ng/mL

## 2015-12-29 LAB — I-STAT CG4 LACTIC ACID, ED: LACTIC ACID, VENOUS: 2.05 mmol/L — AB (ref 0.5–2.0)

## 2015-12-29 LAB — I-STAT TROPONIN, ED: Troponin i, poc: 0.04 ng/mL (ref 0.00–0.08)

## 2015-12-29 MED ORDER — PANTOPRAZOLE SODIUM 40 MG PO TBEC
40.0000 mg | DELAYED_RELEASE_TABLET | Freq: Every day | ORAL | Status: DC
  Administered 2015-12-29 – 2016-01-02 (×2): 40 mg via ORAL
  Filled 2015-12-29 (×3): qty 1

## 2015-12-29 MED ORDER — SODIUM CHLORIDE 0.9 % IV BOLUS (SEPSIS)
500.0000 mL | Freq: Once | INTRAVENOUS | Status: AC
  Administered 2015-12-29: 500 mL via INTRAVENOUS

## 2015-12-29 MED ORDER — VITAMIN D 1000 UNITS PO TABS
1000.0000 [IU] | ORAL_TABLET | Freq: Every day | ORAL | Status: DC
  Administered 2015-12-29 – 2016-01-02 (×2): 1000 [IU] via ORAL
  Filled 2015-12-29 (×3): qty 1

## 2015-12-29 MED ORDER — VANCOMYCIN HCL IN DEXTROSE 1-5 GM/200ML-% IV SOLN: 1000.0000 mg | INTRAVENOUS | Status: DC

## 2015-12-29 MED ORDER — PIPERACILLIN-TAZOBACTAM IN DEX 2-0.25 GM/50ML IV SOLN
2.2500 g | Freq: Three times a day (TID) | INTRAVENOUS | Status: DC
  Administered 2015-12-29 – 2015-12-30 (×2): 2.25 g via INTRAVENOUS
  Filled 2015-12-29 (×2): qty 50

## 2015-12-29 MED ORDER — ACETAMINOPHEN 325 MG PO TABS
650.0000 mg | ORAL_TABLET | ORAL | Status: DC | PRN
  Filled 2015-12-29: qty 2

## 2015-12-29 MED ORDER — VANCOMYCIN HCL 10 G IV SOLR
1500.0000 mg | Freq: Once | INTRAVENOUS | Status: AC
  Administered 2015-12-29: 1500 mg via INTRAVENOUS
  Filled 2015-12-29: qty 1500

## 2015-12-29 MED ORDER — ENSURE ENLIVE PO LIQD
237.0000 mL | Freq: Two times a day (BID) | ORAL | Status: DC
  Administered 2015-12-29 – 2016-01-02 (×4): 237 mL via ORAL

## 2015-12-29 MED ORDER — SERTRALINE HCL 25 MG PO TABS
37.5000 mg | ORAL_TABLET | Freq: Every day | ORAL | Status: DC
Start: 2015-12-29 — End: 2015-12-30
  Filled 2015-12-29 (×2): qty 1.5

## 2015-12-29 MED ORDER — PIPERACILLIN-TAZOBACTAM 3.375 G IVPB 30 MIN
3.3750 g | Freq: Once | INTRAVENOUS | Status: AC
  Administered 2015-12-29: 3.375 g via INTRAVENOUS
  Filled 2015-12-29: qty 50

## 2015-12-29 MED ORDER — SODIUM CHLORIDE 0.9 % IV SOLN
INTRAVENOUS | Status: DC
  Administered 2015-12-29: 18:00:00 via INTRAVENOUS

## 2015-12-29 MED ORDER — WARFARIN - PHARMACIST DOSING INPATIENT: Freq: Every day | Status: DC

## 2015-12-29 NOTE — ED Provider Notes (Addendum)
CSN: [id number]     Arrival date & time 12/29/15  1052 History   First MD Initiated Contact with Patient 12/29/15 1055     Chief Complaint  Patient presents with  . Weakness     (Consider location/radiation/quality/duration/timing/severity/associated sxs/prior Treatment) HPI   Patient is a 82 year old female with dementia presenting from nursing home. Patient is unable to tell me anything including her name.    I called the nursing home. They report that over the last several weeks patient's been declining. She's been refusing to eat. She's been stating "I want to die". They have a scheduled appointment with psychiatry. They've not noticed any fevers, cough, or other concerns. She has been treated recently for urinary tract infection and is currently on Cipro for that.   Reportedly she has been refusing to eat or drink at [facility].  Level V caveat dementia.  Past Medical History  Diagnosis Date  . Chronic kidney disease (CKD)   . Atrial fibrillation (HCC)   . Anemia    History reviewed. No pertinent past surgical history. No family history on file. Social History  Substance Use Topics  . Smoking status: Former Games developer  . Smokeless tobacco: Never Used  . Alcohol Use: No   OB History    No data available     Review of Systems  Unable to perform ROS: Dementia      Allergies  Sulfa antibiotics  Home Medications   Prior to Admission medications   Medication Sig Start Date End Date Taking? Authorizing Provider  cholecalciferol (VITAMIN D) 1000 units tablet Take 1,000 Units by mouth daily.   Yes Historical Provider, MD  ciprofloxacin (CIPRO) 250 MG tablet Take 250 mg by mouth 2 (two) times daily. For 7 days   Yes Historical Provider, MD  ENSURE (ENSURE) Take 237 mLs by mouth 2 (two) times daily.   Yes Historical Provider, MD  pantoprazole (PROTONIX) 40 MG tablet Take 40 mg by mouth daily. 12/01/15  Yes Historical Provider, MD  sertraline (ZOLOFT) 25 MG tablet Take 37.5 mg by  mouth daily.  12/01/15  Yes Historical Provider, MD  warfarin (COUMADIN) 1 MG tablet Take 0.5 mg by mouth daily.   Yes Historical Provider, MD  acetaminophen (TYLENOL) 325 MG tablet Take 650 mg by mouth every 4 (four) hours as needed for mild pain.    Historical Provider, MD  Amino Acids-Protein Hydrolys (FEEDING SUPPLEMENT, PRO-STAT SUGAR FREE 64,) LIQD Take 30 mLs by mouth 2 (two) times daily.    Historical Provider, MD  cephALEXin (KEFLEX) 250 MG capsule Take 1 capsule (250 mg total) by mouth every 8 (eight) hours. 12/07/15   Darreld Mclean, MD  warfarin (COUMADIN) 2 MG tablet Take 2 mg by mouth every Tuesday, Thursday, Saturday, and Sunday at 6 PM.    Historical Provider, MD  warfarin (COUMADIN) 2.5 MG tablet Take 2.5 mg by mouth every Monday, Wednesday, and Friday.    Historical Provider, MD   BP 106/81 mmHg  Pulse 95  Temp(Src) 97.6 F (36.4 C)  Resp 17  SpO2 96% Physical Exam  Constitutional: She appears well-developed and well-nourished.  HENT:  Head: Normocephalic and atraumatic.  Eyes: Conjunctivae are normal. Right eye exhibits no discharge.  Mild amount of crusty bilateral eyes.  Neck: Neck supple.  Cardiovascular: Normal rate, regular rhythm and normal heart sounds.   No murmur heard. Pulmonary/Chest: Effort normal and breath sounds normal. She has no wheezes. She has no rales.  Abdominal: Soft. She exhibits no distension. There is  no tenderness.  Musculoskeletal: Normal range of motion. She exhibits no edema.  Neurological: No cranial nerve deficit.  Patient says "don't touch me". Refuses  to talk to me.  Skin: Skin is warm and dry. No rash noted. She is not diaphoretic.  Nursing note and vitals reviewed.   ED Course  Procedures (including critical care time) Labs Review Labs Reviewed  BASIC METABOLIC PANEL - Abnormal; Notable for the following:    Chloride 97 (*)    CO2 21 (*)    Glucose, Bld 108 (*)    BUN 23 (*)    Creatinine, Ser 2.18 (*)    GFR calc non Af  Amer 18 (*)    GFR calc Af Amer 21 (*)    Anion gap 22 (*)    All other components within normal limits  CBC - Abnormal; Notable for the following:    WBC 11.1 (*)    RBC 5.25 (*)    Hemoglobin 11.6 (*)    MCV 74.3 (*)    MCH 22.1 (*)    MCHC 29.7 (*)    RDW 19.9 (*)    All other components within normal limits  URINALYSIS, ROUTINE W REFLEX MICROSCOPIC (NOT AT [facility]) - Abnormal; Notable for the following:    Color, Urine AMBER (*)    APPearance HAZY (*)    Hgb urine dipstick TRACE (*)    Bilirubin Urine LARGE (*)    Ketones, ur 15 (*)    Protein, ur 30 (*)    Leukocytes, UA SMALL (*)    All other components within normal limits  URINE MICROSCOPIC-ADD ON - Abnormal; Notable for the following:    Squamous Epithelial / LPF 6-30 (*)    Bacteria, UA FEW (*)    Casts HYALINE CASTS (*)    All other components within normal limits  I-STAT CG4 LACTIC ACID, ED - Abnormal; Notable for the following:    Lactic Acid, Venous 2.05 (*)    All other components within normal limits  URINE CULTURE  LIPASE, BLOOD  AMMONIA  PROTIME-INR  I-STAT TROPOININ, ED    Imaging Review Dg Chest 2 View  12/29/2015  CLINICAL DATA:  Chest/back pain.  Altered mental status EXAM: CHEST  2 VIEW COMPARISON:  December 06, 2015 FINDINGS: There is patchy airspace consolidation in the left lower lobe. There is slight scarring in the right mid lung. Lungs elsewhere clear. Heart remains enlarged with pulmonary vascularity within normal limits. No adenopathy. There is atherosclerotic change in the aorta. There is upper thoracic levoscoliosis with mid and lower thoracic dextroscoliosis. IMPRESSION: Left lower lobe consolidation. Question pneumonia or possibly aspiration pneumonitis in this area. Slight scarring right mid lung. Lungs elsewhere clear. Stable cardiac enlargement. Electronically Signed   By: Bretta BangWilliam  Woodruff III M.D.   On: 12/29/2015 12:40   I have personally reviewed and evaluated these images and lab  results as part of my medical decision-making.   EKG Interpretation   Date/Time:  Friday December 29 2015 11:02:04 EDT Ventricular Rate:  100 PR Interval:    QRS Duration: 107 QT Interval:  348 QTC Calculation: 449 R Axis:   -44 Text Interpretation:  Atrial fibrillation Abnormal R-wave progression,  late transition Left ventricular hypertrophy Inferior infarct, age  indeterminate Atrial fibrillation No significant change since last tracing  Confirmed by Kandis MannanMACKUEN, COURTNEY ([id number]) on 12/29/2015 11:10:55 AM      MDM   Final diagnoses:  None   Patient is a demented [age] year old female presenting with dwindling  interest in food and interaction over the last several weeks. We'll make sure that there is no biologic cause for this that is fixable today. We'll make sure the patient does not have worsening UTI, or other infection. Make sure the patient is not dehydrated. Patient has outpatient psych visit planned.   Physical exam nomral except for the fact that patient is fatigued, non cooperative.  Vital signs are all normal.  1:52 PM Patietn has pneumonia, and lab abnormalities with anion gap, AKI.   Will give fluids, admit.   Samiel Peel Randall An, MD 12/29/15 1227  Kole Hilyard Randall An, MD 12/29/15 1352

## 2015-12-29 NOTE — ED Notes (Signed)
Admitting at bedside.  Discussed need for palliative care consult and pt's wishes.

## 2015-12-29 NOTE — ED Notes (Signed)
Patient comes from [facility]. Per EMS patient is 5 days treatment of UTI and has started felling weak. Per EMS patient has not eaten or drinking in the last 3 days. Patient is only alert to self and location at baseline. Patient responds to voice GCS 15. When completing assessment  Questions refused to answer questions.

## 2015-12-29 NOTE — ED Notes (Signed)
RN attempted to collect all the blood x2 Phlebotomy notified advised would collect.

## 2015-12-29 NOTE — ED Notes (Signed)
Attempted report 

## 2015-12-29 NOTE — H&P (Signed)
Date: 12/29/2015               Patient Name:  Mary Mcclain MRN: [id number]  DOB: Mar 06, 1917 Age / Sex: 82 y.o., female   PCP: No primary care provider on file.         Medical Service: Internal Medicine Teaching Service         Attending Physician: Dr. Levert Feinstein, MD    First Contact: Dr. Reubin Milan Pager: [phone]  Second Contact: Dr. Gara Kroner Pager: [phone]       After Hours (After 5p/  First Contact Pager: [phone]  weekends / holidays): Second Contact Pager: [phone]   Chief Complaint: "I don't care anymore"  History of Present Illness: Mary Mcclain is a 82yo with dementia, Atrial fibrillation on coumadin, CKD stage 3, depression, and recent admission for confusion due to UTI who presented from [facility] The patient is uninterested in speaking, says "I don't know, I don't care anymore" when asked questions. Per the nursing home, she has been refusing to eat, generally weaker over the past several weeks. She denies any other specific complaints, including fevers, confusion, falls, headache, vision changes, chest pain, cough, shortness of breath, abdominal pain, N/V/D, constipation, urinary symptoms or other issues at this time. She was admitted for confusion 2/2 UTI last month, sent on keflex. Apparently, she has been receiving ciprofloxacin at the nursing home for a UTI as well. She denies HI, but does have passive suicidal ideation.  Meds: Current Facility-Administered Medications  Medication Dose Route Frequency Provider Last Rate Last Dose  . 0.9 %  sodium chloride infusion   Intravenous Continuous Denton Brick, MD      . acetaminophen (TYLENOL) tablet 650 mg  650 mg Oral Q4H PRN Denton Brick, MD      . cholecalciferol (VITAMIN D) tablet 1,000 Units  1,000 Units Oral Daily Denton Brick, MD      . feeding supplement (ENSURE ENLIVE) (ENSURE ENLIVE) liquid 237 mL  237 mL Oral BID Denton Brick, MD      . pantoprazole  (PROTONIX) EC tablet 40 mg  40 mg Oral Daily Denton Brick, MD      . piperacillin-tazobactam (ZOSYN) IVPB 2.25 g  2.25 g Intravenous Q8H Almon Hercules, [facility]      . sertraline (ZOLOFT) tablet 37.5 mg  37.5 mg Oral Daily Denton Brick, MD      . sodium chloride 0.9 % bolus 500 mL  500 mL Intravenous Once Denton Brick, MD      . vancomycin (VANCOCIN) 1,500 mg in sodium chloride 0.9 % 500 mL IVPB  1,500 mg Intravenous Once Almon Hercules, RPH 250 mL/hr at 12/29/15 1521 1,500 mg at 12/29/15 1521  . [START ON 12/31/2015] vancomycin (VANCOCIN) IVPB 1000 mg/200 mL premix  1,000 mg Intravenous Q48H Almon Hercules, [facility]      . [START ON 12/30/2015] Warfarin - Pharmacist Dosing Inpatient   Does not apply q1800 Marquita Palms, [facility]        Allergies: Allergies as of 12/29/2015 - Review Complete 12/29/2015  Allergen Reaction Noted  . Sulfa antibiotics Other (See Comments) 12/04/2015   Past Medical History  Diagnosis Date  . Chronic kidney disease (CKD)   . Atrial fibrillation (HCC)   . Anemia    History reviewed. No pertinent past surgical history. No family history on file. Social History   Social History  . Marital Status: Widowed  Spouse Name: N/A  . Number of Children: N/A  . Years of Education: N/A   Occupational History  . Not on file.   Social History Main Topics  . Smoking status: Former Games developer  . Smokeless tobacco: Never Used  . Alcohol Use: No  . Drug Use: No  . Sexual Activity: No   Other Topics Concern  . Not on file   Social History Narrative   Review of Systems: Pertinent items noted in HPI and remainder of comprehensive ROS otherwise negative.  Physical Exam: Blood pressure 112/64, pulse 95, temperature 97.6 F (36.4 C), resp. rate 15, height  (1.702 m), weight 170 lb (77.111 kg), SpO2 95 %.   Gen: Cachectic-appearing, alert and oriented to person, place, and time HEENT: Oropharynx clear without erythema or exudate. Arcus senilis present. Neck: No cervical  LAD, no thyromegaly or nodules, no JVD noted. CV: Normal rate, irregularly irregular rhythm, no murmurs, rubs, or gallops Pulmonary: Normal effort, CTA bilaterally, no crackles or wheezes Abdominal: Soft, non-tender, non-distended, without rebound, guarding, or masses Extremities: Distal pulses 2+ in upper and lower extremities bilaterally, no tenderness, erythema or edema. There is a large ecchymosis on the rt shin. Neuro: CN II-XII grossly intact, no focal weakness or sensory deficits noted Skin: No atypical appearing moles. No rashes  Lab results: Basic Metabolic Panel:  Recent Labs  16/10/96 1223  NA 140  K 3.5  CL 97*  CO2 21*  GLUCOSE 108*  BUN 23*  CREATININE 2.18*  CALCIUM 9.6    Recent Labs  12/29/15 1223  LIPASE 25    Recent Labs  12/29/15 1341  AMMONIA 25   CBC:  Recent Labs  12/29/15 1223  WBC 11.1*  HGB 11.6*  HCT 39.0  MCV 74.3*  PLT 224   Coagulation:  Recent Labs  12/29/15 1341  LABPROT 34.8*  INR 3.55*   Urinalysis:  Recent Labs  12/29/15 1226  COLORURINE AMBER*  LABSPEC 1.016  PHURINE 5.5  GLUCOSEU NEGATIVE  HGBUR TRACE*  BILIRUBINUR LARGE*  KETONESUR 15*  PROTEINUR 30*  NITRITE NEGATIVE  LEUKOCYTESUR SMALL*   Imaging results:  Dg Chest 2 View  12/29/2015  CLINICAL DATA:  Chest/back pain.  Altered mental status EXAM: CHEST  2 VIEW COMPARISON:  December 06, 2015 FINDINGS: There is patchy airspace consolidation in the left lower lobe. There is slight scarring in the right mid lung. Lungs elsewhere clear. Heart remains enlarged with pulmonary vascularity within normal limits. No adenopathy. There is atherosclerotic change in the aorta. There is upper thoracic levoscoliosis with mid and lower thoracic dextroscoliosis. IMPRESSION: Left lower lobe consolidation. Question pneumonia or possibly aspiration pneumonitis in this area. Slight scarring right mid lung. Lungs elsewhere clear. Stable cardiac enlargement. Electronically Signed    By: Bretta Bang III M.D.   On: 12/29/2015 12:40   Other results: EKG: atrial fibrillation, rate 103.  Assessment & Plan by Problem: 1. Generalized weakness and depression - poor PO intake, worsening generalized weakness over several weeks. No overt signs or symptoms of infection, WBC 11. CXR likely atelectasis but possible aspiration pnenomitis or PNA, although much less likely given no symptoms. UA dirty, without urinary symptoms currently. -Palliative care consult for goals of care -Psychiatry consult -Vanc + Zosyn x 1; will obtain PCT and determine abx afterward -Trend CBC, lactate -TSH -Continue home Sertraline 25 mg po daily  2. AKI - baseline ~1.2, elevated currently likely 2/2 decreased PO intake, clearly dehydarted -IVNS bolus, then MIVF -Trend renal function panel  3. Chronic atrial fibrillation: Rate controlled w/o BB or CCB. On coumadin for A/C but INR supra-therapeutic here -Coumadin per pharmacy. Hold currently for supratherapeutic INR  Dispo: Disposition is deferred at this time, awaiting improvement of current medical problems. Anticipated discharge in approximately 1-2 day(s).   The patient does not have a current PCP (No primary care provider on file.) and does [facility] follow[facility] after discharge.  The patient does have transportation limitations that hinder transportation to clinic appointments.  Signed: Darrick HuntsmanWilliam R Tejay Hubert, MD 12/29/2015, 4:26 PM

## 2015-12-29 NOTE — Progress Notes (Signed)
ANTICOAGULATION CONSULT NOTE - Initial Consult  Pharmacy Consult for Warfarin Indication: atrial fibrillation  Allergies  Allergen Reactions  . Sulfa Antibiotics Other (See Comments)    Pt stated it has been so long ago that she didn't remember what happens    Patient Measurements: Height: 5\' 7"  (170.2 cm) Weight: 170 lb (77.111 kg) IBW/kg (Calculated) : 61.6 Heparin Dosing Weight:   Vital Signs: Temp: 97.6 F (36.4 C) (03/17 1107) BP: 122/74 mmHg (03/17 1445) Pulse Rate: 95 (03/17 1107)  Labs:  Recent Labs  12/29/15 1223 12/29/15 1341  HGB 11.6*  --   HCT 39.0  --   PLT 224  --   LABPROT  --  34.8*  INR  --  3.55*  CREATININE 2.18*  --     Estimated Creatinine Clearance: 15.8 mL/min (by C-G formula based on Cr of 2.18).   Medical History: Past Medical History  Diagnosis Date  . Chronic kidney disease (CKD)   . Atrial fibrillation (HCC)   . Anemia     Medications:   (Not in a hospital admission) Scheduled:   Infusions:  . piperacillin-tazobactam (ZOSYN)  IV    . vancomycin    . [START ON 12/31/2015] vancomycin      Assessment: 82yo female with history of Afib on warfarin PTA presents with weakness. Pharmacy is consulted to dose warfarin for atrial fibrillation. INR is 3.55.  PTA Warfarin Dose: 0.5mg /d with last dose 3/16  Goal of Therapy:  INR 2-3 Monitor platelets by anticoagulation protocol: Yes   Plan:  Hold warfarin tonight Daily INR Monitor s/sx of bleeding  Arlean Hoppingorey M. Newman PiesBall, PharmD, BCPS Clinical Pharmacist Pager [phone] 12/29/2015,2:58 PM

## 2015-12-29 NOTE — Progress Notes (Signed)
Pharmacy Antibiotic Note  Mary SchimkeLouise Mcclain is a 82 y.o. female admitted on 12/29/2015 with pneumonia.  Pharmacy has been consulted for vanc/zosyn dosing. Afeb, wbc 11.1. SCr 2.18 on admit, CrCl~17. Estimated wt~170lbs in the ED.  Plan: Zosyn 30min inf x1; then Zosyn 2.25g IV q8h Vanc 1500mg  IV x1; then 1g IV 48h Monitor clinical progress, c/s, renal function, abx plan/LOT VT@SS  as indicated     Temp (24hrs), Avg:97.6 F (36.4 C), Min:97.6 F (36.4 C), Max:97.6 F (36.4 C)   Recent Labs Lab 12/29/15 1223 12/29/15 1226  WBC 11.1*  --   CREATININE 2.18*  --   LATICACIDVEN  --  2.05*    CrCl cannot be calculated (Unknown ideal weight.).    Allergies  Allergen Reactions  . Sulfa Antibiotics Other (See Comments)    Pt stated it has been so long ago that she didn't remember what happens    Antimicrobials this admission: 3/17 vanc >>  3/17 zosyn >>   Dose adjustments this admission: N/a  Microbiology results: 3/17 UCx:     Babs BertinHaley Lorre Mcclain, PharmD, BCPS Clinical Pharmacist Pager [phone] 12/29/2015 1:56 PM

## 2015-12-30 DIAGNOSIS — E860 Dehydration: Secondary | ICD-10-CM | POA: Diagnosis not present

## 2015-12-30 DIAGNOSIS — F329 Major depressive disorder, single episode, unspecified: Secondary | ICD-10-CM | POA: Diagnosis not present

## 2015-12-30 DIAGNOSIS — R531 Weakness: Secondary | ICD-10-CM

## 2015-12-30 DIAGNOSIS — N179 Acute kidney failure, unspecified: Secondary | ICD-10-CM

## 2015-12-30 LAB — URINE CULTURE: Culture: NO GROWTH

## 2015-12-30 LAB — CREATININE, URINE, RANDOM: CREATININE, URINE: 154.18 mg/dL

## 2015-12-30 LAB — MRSA PCR SCREENING: MRSA by PCR: NEGATIVE

## 2015-12-30 LAB — INFLUENZA PANEL BY PCR (TYPE A & B)
H1N1 flu by pcr: NOT DETECTED
INFLBPCR: NEGATIVE
Influenza A By PCR: NEGATIVE

## 2015-12-30 LAB — PROTIME-INR
INR: 3.92 — ABNORMAL HIGH (ref 0.00–1.49)
PROTHROMBIN TIME: 37.4 s — AB (ref 11.6–15.2)

## 2015-12-30 LAB — SODIUM, URINE, RANDOM: SODIUM UR: 23 mmol/L

## 2015-12-30 MED ORDER — AZITHROMYCIN 250 MG PO TABS: 250.0000 mg | ORAL_TABLET | Freq: Every day | ORAL | Status: DC

## 2015-12-30 MED ORDER — MIRTAZAPINE 7.5 MG PO TABS
7.5000 mg | ORAL_TABLET | Freq: Every day | ORAL | Status: DC
  Administered 2015-12-30 – 2016-01-01 (×3): 7.5 mg via ORAL
  Filled 2015-12-30 (×5): qty 1

## 2015-12-30 MED ORDER — AZITHROMYCIN 250 MG PO TABS
500.0000 mg | ORAL_TABLET | Freq: Every day | ORAL | Status: DC
  Filled 2015-12-30: qty 2

## 2015-12-30 MED ORDER — PHENOL 1.4 % MT LIQD: 1.0000 | OROMUCOSAL | Status: DC | PRN

## 2015-12-30 NOTE — Consult Note (Signed)
[place] Psychiatry Consult   Reason for Consult:  Severe depression, weight loss Referring Physician: Dr. Wendall Papa Patient Identification: Mary Mcclain MRN:  [id number] Principal Diagnosis: Major depression, chronic ([place]) Diagnosis:   Patient Active Problem List   Diagnosis Date Noted  . Major depression, chronic (HCC) [F32.9]     Priority: High  . HCAP (healthcare-associated pneumonia) [J18.9] 12/29/2015  . AKI (acute kidney injury) ([place]) [N17.9] 12/29/2015  . Pressure ulcer [L89.90] 12/29/2015  . Acute encephalopathy [G93.40] 12/06/2015  . UTI (urinary tract infection) [N39.0] 12/06/2015  . CKD (chronic kidney disease) [N18.9] 12/06/2015  . Chronic atrial fibrillation ([place]) [I48.2] 12/06/2015  . Acute cystitis without hematuria [N30.00]   . Stage 3 chronic kidney disease due to arterionephrosclerosis [I12.9, N18.3]   . Aortic atherosclerosis ([place]) [I70.0]     Total Time spent with patient: 1 hour  Subjective:   Mary Mcclain is a 82 y.o. female patient admitted with food refusal and weakness.  HPI:  Thanks for asking me to do a psychiatric evaluation on Mary Mcclain,  a [place] woman with dementia, severe depression, Atrial fibrillation on coumadin, CKD stage 3. She is a poor historian, most history was obtained from her daughter at her hospital bedside. Patient lived with her daughter for 9 years until she decided with her family to move to [facility]).  Per her daughter, patient was doing fairly well until few days ago when she received a call from the assisted living facility who informed her that her mother was refusing to eat and seems to be getting deeper into depressive mood alternating with anger outburst. Patient seems very depressed, hopeless, with low energy level, amotivated and uninterested in her environment. She is refusing to eat, admits to poor sleep and kept saying: ''I don't care anymore, leave me alone.'' Patient  denies suicidal ideations, psychosis or delusional thinking.  Past Psychiatric History:MDD  Risk to Self: Is patient at risk for suicide?: No Risk to Others:   Prior Inpatient Therapy:   Prior Outpatient Therapy:    Past Medical History:  Past Medical History  Diagnosis Date  . Chronic kidney disease (CKD)   . Atrial fibrillation ([place])   . Anemia    History reviewed. No pertinent past surgical history. Family History: No family history on file. Family Psychiatric  History: Social History:  History  Alcohol Use No     History  Drug Use No    Social History   Social History  . Marital Status: Widowed    Spouse Name: N/A  . Number of Children: N/A  . Years of Education: N/A   Social History Main Topics  . Smoking status: Former Research scientist (life sciences)  . Smokeless tobacco: Never Used  . Alcohol Use: No  . Drug Use: No  . Sexual Activity: No   Other Topics Concern  . None   Social History Narrative   Additional Social History:    Allergies:   Allergies  Allergen Reactions  . Sulfa Antibiotics Other (See Comments)    Pt stated it has been so long ago that she didn't remember what happens    Labs:  Results for orders placed or performed during the hospital encounter of 12/29/15 (from the past 48 hour(s))  Sodium, urine, random     Status: None   Collection Time: 12/29/15 10:38 AM  Result Value Ref Range   Sodium, Ur 23 mmol/L  Creatinine, urine, random     Status: None   Collection Time: 12/29/15  10:38 AM  Result Value Ref Range   Creatinine, Urine 154.18 mg/dL  Basic metabolic panel     Status: Abnormal   Collection Time: 12/29/15 12:23 PM  Result Value Ref Range   Sodium 140 135 - 145 mmol/L   Potassium 3.5 3.5 - 5.1 mmol/L   Chloride 97 (L) 101 - 111 mmol/L   CO2 21 (L) 22 - 32 mmol/L   Glucose, Bld 108 (H) 65 - 99 mg/dL   BUN 23 (H) 6 - 20 mg/dL   Creatinine, Ser 2.18 (H) 0.44 - 1.00 mg/dL   Calcium 9.6 8.9 - 10.3 mg/dL   GFR calc non Af Amer 18 (L) >60 mL/min    GFR calc Af Amer 21 (L) >60 mL/min    Comment: (NOTE) The eGFR has been calculated using the CKD EPI equation. This calculation has not been validated in all clinical situations. eGFR's persistently <60 mL/min signify possible Chronic Kidney Disease.    Anion gap 22 (H) 5 - 15  CBC     Status: Abnormal   Collection Time: 12/29/15 12:23 PM  Result Value Ref Range   WBC 11.1 (H) 4.0 - 10.5 K/uL   RBC 5.25 (H) 3.87 - 5.11 MIL/uL   Hemoglobin 11.6 (L) 12.0 - 15.0 g/dL   HCT 39.0 36.0 - 46.0 %   MCV 74.3 (L) 78.0 - 100.0 fL   MCH 22.1 (L) 26.0 - 34.0 pg   MCHC 29.7 (L) 30.0 - 36.0 g/dL   RDW 19.9 (H) 11.5 - 15.5 %   Platelets 224 150 - 400 K/uL  Lipase, blood     Status: None   Collection Time: 12/29/15 12:23 PM  Result Value Ref Range   Lipase 25 11 - 51 U/L  I-stat troponin, ED     Status: None   Collection Time: 12/29/15 12:24 PM  Result Value Ref Range   Troponin i, poc 0.04 0.00 - 0.08 ng/mL   Comment 3            Comment: Due to the release kinetics of cTnI, a negative result within the first hours of the onset of symptoms does not rule out myocardial infarction with certainty. If myocardial infarction is still suspected, repeat the test at appropriate intervals.   Urinalysis, Routine w reflex microscopic (not at [facility])     Status: Abnormal   Collection Time: 12/29/15 12:26 PM  Result Value Ref Range   Color, Urine AMBER (A) YELLOW    Comment: BIOCHEMICALS MAY BE AFFECTED BY COLOR   APPearance HAZY (A) CLEAR   Specific Gravity, Urine 1.016 1.005 - 1.030   pH 5.5 5.0 - 8.0   Glucose, UA NEGATIVE NEGATIVE mg/dL   Hgb urine dipstick TRACE (A) NEGATIVE   Bilirubin Urine LARGE (A) NEGATIVE   Ketones, ur 15 (A) NEGATIVE mg/dL   Protein, ur 30 (A) NEGATIVE mg/dL   Nitrite NEGATIVE NEGATIVE   Leukocytes, UA SMALL (A) NEGATIVE  I-Stat CG4 Lactic Acid, ED     Status: Abnormal   Collection Time: 12/29/15 12:26 PM  Result Value Ref Range   Lactic Acid, Venous 2.05 (HH)  0.5 - 2.0 mmol/L   Comment NOTIFIED PHYSICIAN   Urine microscopic-add on     Status: Abnormal   Collection Time: 12/29/15 12:26 PM  Result Value Ref Range   Squamous Epithelial / LPF 6-30 (A) NONE SEEN   WBC, UA 0-5 0 - 5 WBC/hpf   RBC / HPF 0-5 0 - 5 RBC/hpf   Bacteria,  UA FEW (A) NONE SEEN   Casts HYALINE CASTS (A) NEGATIVE   Urine-Other MUCOUS PRESENT   Urine culture     Status: None   Collection Time: 12/29/15 12:27 PM  Result Value Ref Range   Specimen Description URINE, CATHETERIZED    Special Requests NONE    Culture NO GROWTH 1 DAY    Report Status 12/30/2015 FINAL   Ammonia     Status: None   Collection Time: 12/29/15  1:41 PM  Result Value Ref Range   Ammonia 25 9 - 35 umol/L  Protime-INR     Status: Abnormal   Collection Time: 12/29/15  1:41 PM  Result Value Ref Range   Prothrombin Time 34.8 (H) 11.6 - 15.2 seconds   INR 3.55 (H) 0.00 - 1.49  TSH     Status: None   Collection Time: 12/29/15  4:13 PM  Result Value Ref Range   TSH 2.582 0.350 - 4.500 uIU/mL  Procalcitonin - Baseline     Status: None   Collection Time: 12/29/15  4:14 PM  Result Value Ref Range   Procalcitonin 1.17 ng/mL    Comment:        Interpretation: PCT > 0.5 ng/mL and <= 2 ng/mL: Systemic infection (sepsis) is possible, but other conditions are known to elevate PCT as well. (NOTE)         ICU PCT Algorithm               Non ICU PCT Algorithm    ----------------------------     ------------------------------         PCT < 0.25 ng/mL                 PCT < 0.1 ng/mL     Stopping of antibiotics            Stopping of antibiotics       strongly encouraged.               strongly encouraged.    ----------------------------     ------------------------------       PCT level decrease by               PCT < 0.25 ng/mL       >= 80% from peak PCT       OR PCT 0.25 - 0.5 ng/mL          Stopping of antibiotics                                             encouraged.     Stopping of antibiotics            encouraged.    ----------------------------     ------------------------------       PCT level decrease by              PCT >= 0.25 ng/mL       < 80% from peak PCT        AND PCT >= 0.5 ng/mL             Continuing antibiotics                                              encouraged.  Continuing antibiotics            encouraged.    ----------------------------     ------------------------------     PCT level increase compared          PCT > 0.5 ng/mL         with peak PCT AND          PCT >= 0.5 ng/mL             Escalation of antibiotics                                          strongly encouraged.      Escalation of antibiotics        strongly encouraged.   Influenza panel by PCR (type A & B, H1N1)     Status: None   Collection Time: 12/29/15 11:11 PM  Result Value Ref Range   Influenza A By PCR NEGATIVE NEGATIVE   Influenza B By PCR NEGATIVE NEGATIVE   H1N1 flu by pcr NOT DETECTED NOT DETECTED    Comment:        The Xpert Flu assay (FDA approved for nasal aspirates or washes and nasopharyngeal swab specimens), is intended as an aid in the diagnosis of influenza and should not be used as a sole basis for treatment.   Protime-INR     Status: Abnormal   Collection Time: 12/30/15  5:57 AM  Result Value Ref Range   Prothrombin Time 37.4 (H) 11.6 - 15.2 seconds   INR 3.92 (H) 0.00 - 1.49    Current Facility-Administered Medications  Medication Dose Route Frequency Provider Last Rate Last Dose  . 0.9 %  sodium chloride infusion   Intravenous Continuous Norman Herrlich, MD 100 mL/hr at 12/29/15 1744    . acetaminophen (TYLENOL) tablet 650 mg  650 mg Oral Q4H PRN Norman Herrlich, MD      . azithromycin [facility]) tablet 500 mg  500 mg Oral Daily Norval Gable, MD   500 mg at 12/30/15 1336   Followed by  . [START ON 12/31/2015] azithromycin (ZITHROMAX) tablet 250 mg  250 mg Oral Daily Norval Gable, MD      . cholecalciferol (VITAMIN D) tablet 1,000 Units  1,000  Units Oral Daily Norman Herrlich, MD   1,000 Units at 12/29/15 1744  . feeding supplement (ENSURE ENLIVE) (ENSURE ENLIVE) liquid 237 mL  237 mL Oral BID Norman Herrlich, MD   237 mL at 12/29/15 2235  . mirtazapine (REMERON) tablet 7.5 mg  7.5 mg Oral QHS Tyquez Hollibaugh, MD      . pantoprazole (PROTONIX) EC tablet 40 mg  40 mg Oral Daily Norman Herrlich, MD   40 mg at 12/29/15 1743  . Warfarin - Pharmacist Dosing Inpatient   Does not apply [place], [facility]   Stopped at 12/30/15 1800    Musculoskeletal: Strength & Muscle Tone: decreased Gait & Station: unsteady Patient leans: Front  Psychiatric Specialty Exam: Review of Systems  Constitutional: Positive for malaise/fatigue.  Eyes: Negative.   Skin: Negative.   Neurological: Positive for weakness.  Psychiatric/Behavioral: Positive for depression. The patient has insomnia.     Blood pressure 123/58, pulse 94, temperature 98.9 F (37.2 C), temperature source Oral, resp. rate 18, height '5\' 7"'$  (1.702 m), weight 70.2 kg (154 lb 12.2 oz), SpO2 99 %.Body mass  index is 24.23 kg/(m^2).  General Appearance: Casual  Eye Contact::  Minimal  Speech:  Clear and Coherent  Volume:  Increased  Mood:  Dysphoric and Irritable  Affect:  Constricted  Thought Process:  Disorganized  Orientation:  Other:  disoriented to time and place  Thought Content:  unable to assess  Suicidal Thoughts:  No  Homicidal Thoughts:  No  Memory:  Immediate;   Poor Recent;   Poor Remote;   Fair  Judgement:  Impaired  Insight:  Shallow  Psychomotor Activity:  Increased  Concentration:  Fair  Recall:  [employer] of Knowledge:Fair  Language: Good  Akathisia:  No  Handed:  Right  AIMS (if indicated):     Assets:  Communication Skills Social Support  ADL's:  Impaired  Cognition: Impaired,  Mild  Sleep:   poor   Treatment Plan Summary: Daily contact with patient to assess and evaluate symptoms and progress in treatment: Medication management: -Discontinue  Zoloft due to poor appetite. -Start Remeron 7.56m qhs to address depression,insomnia and poor appetite.   Disposition:  -No evidence of imminent risk to self or others at present.   -Patient does not meet criteria for psychiatric inpatient admission. -Supportive therapy provided about ongoing stressors. -Will benefit from referral to a Geriatric psychiatrist upon discharge.  ACorena Pilgrim MD 12/30/2015 2:24 PM

## 2015-12-30 NOTE — Progress Notes (Addendum)
ANTICOAGULATION CONSULT NOTE - Follow Up Consult  Pharmacy Consult for Coumadin Indication: atrial fibrillation  Allergies  Allergen Reactions  . Sulfa Antibiotics Other (See Comments)    Pt stated it has been so long ago that she didn't remember what happens    Patient Measurements: Height: 5\' 7"  (170.2 cm) Weight: 154 lb 12.2 oz (70.2 kg) IBW/kg (Calculated) : 61.6 Vital Signs: Temp: 98.9 F (37.2 C) (03/18 0635) Temp Source: Oral (03/18 0635) BP: 123/58 mmHg (03/18 0635) Pulse Rate: 94 (03/18 0635) Labs:  Recent Labs  12/29/15 1223 12/29/15 1341 12/30/15 0557  HGB 11.6*  --   --   HCT 39.0  --   --   PLT 224  --   --   LABPROT  --  34.8* 37.4*  INR  --  3.55* 3.92*  CREATININE 2.18*  --   --     Estimated Creatinine Clearance: 14.3 mL/min (by C-G formula based on Cr of 2.18).  Assessment: 82 year old female on chronic Coumadin for Afib.   INR remains high - last Coumadin dose 3/16.  PTA dose 0.5mg  daily.   Drug-Drug Interaction - Azithromycin  Goal of Therapy:  INR 2-3 Monitor platelets by anticoagulation protocol: Yes   Plan:  Hold Coumadin tonight.  Daily PT/INR  Link SnufferJessica Garon Melander, PharmD, BCPS Clinical Pharmacist [phone]  12/30/2015,11:28 AM

## 2015-12-30 NOTE — Progress Notes (Signed)
Subjective: Mary Mcclain had no acute events overnight. She is alert and responsive this morning but still uninterested in more than 'yes/no' questions. She complaints of no new symptoms.  Objective: Vital signs in last 24 hours: Filed Vitals:   12/29/15 1600 12/29/15 1645 12/29/15 2220 12/30/15 0635  BP: 112/64 118/78 111/54 123/58  Pulse:  91 94 94  Temp:  97.5 F (36.4 C) 98 F (36.7 C) 98.9 F (37.2 C)  TempSrc:  Oral Oral Oral  Resp: Height:      Weight:  154 lb 12.2 oz (70.2 kg)    SpO2:  95% 98% 99%    Gen: Cachectic-appearing, alert and oriented to person, place, and time HEENT: Oropharynx clear without erythema or exudate. Arcus senilis present. Neck: No cervical LAD, no thyromegaly or nodules, no JVD noted. CV: Normal rate, irregularly irregular rhythm, no murmurs, rubs, or gallops Pulmonary: Normal effort, CTA bilaterally, no crackles or wheezes Abdominal: Soft, non-tender, non-distended, without rebound, guarding, or masses Extremities: Distal pulses 2+ in upper and lower extremities bilaterally, no tenderness, erythema or edema. There is a large ecchymosis on the rt shin. Neuro: CN II-XII grossly intact, no focal weakness or sensory deficits noted Skin: No atypical appearing moles. No rashes  Lab Results: Basic Metabolic Panel:  Recent Labs Lab 12/29/15 1223  NA 140  K 3.5  CL 97*  CO2 21*  GLUCOSE 108*  BUN 23*  CREATININE 2.18*  CALCIUM 9.6   CBC:  Recent Labs Lab 12/29/15 1223  WBC 11.1*  HGB 11.6*  HCT 39.0  MCV 74.3*  PLT 224   Thyroid Function Tests:  Recent Labs Lab 12/29/15 1613  TSH 2.582   Coagulation:  Recent Labs Lab 12/29/15 1341 12/30/15 0557  LABPROT 34.8* 37.4*  INR 3.55* 3.92*   Urinalysis:  Recent Labs Lab 12/29/15 1226  COLORURINE AMBER*  LABSPEC 1.016  PHURINE 5.5  GLUCOSEU NEGATIVE  HGBUR TRACE*  BILIRUBINUR LARGE*  KETONESUR 15*  PROTEINUR 30*  NITRITE NEGATIVE  LEUKOCYTESUR SMALL*     Studies/Results: Dg Chest 2 View  12/29/2015  CLINICAL DATA:  Chest/back pain.  Altered mental status EXAM: CHEST  2 VIEW COMPARISON:  December 06, 2015 FINDINGS: There is patchy airspace consolidation in the left lower lobe. There is slight scarring in the right mid lung. Lungs elsewhere clear. Heart remains enlarged with pulmonary vascularity within normal limits. No adenopathy. There is atherosclerotic change in the aorta. There is upper thoracic levoscoliosis with mid and lower thoracic dextroscoliosis. IMPRESSION: Left lower lobe consolidation. Question pneumonia or possibly aspiration pneumonitis in this area. Slight scarring right mid lung. Lungs elsewhere clear. Stable cardiac enlargement. Electronically Signed   By: Bretta Bang III M.D.   On: 12/29/2015 12:40   Assessment/Plan: 1. Generalized weakness and depression - poor PO intake, worsening generalized weakness over several weeks. No overt signs or symptoms of infection, WBC 11. CXR likely atelectasis but possible aspiration pnenomitis or PNA, although much less likely given no symptoms. UA dirty, without urinary symptoms currently. -Palliative care consult for goals of care -Psychiatry consult -Vanc + Zosyn x 2; will d/c as no symptoms and likely atelectasis vs pneumonitis. Can consider azithro PO for 3 days total -Trend CBC, lactate -TSH normal -Continue home Sertraline 25 mg po daily  2. AKI - baseline ~1.2, elevated currently likely 2/2 decreased PO intake, clearly dehydarted -IVNS bolus, then MIVF -Trend renal function panel  Dispo: Disposition is deferred at this time, awaiting improvement of  current medical problems.  Anticipated discharge in approximately 1-3 day(s).   The patient does not have a current PCP (No primary care provider on file.) and [facility].  The patient does have transportation limitations that hinder transportation to clinic  appointments.  Mary HuntsmanWilliam R Daelyn Mozer, MD 12/30/2015, 8:21 AM

## 2015-12-31 DIAGNOSIS — Z7901 Long term (current) use of anticoagulants: Secondary | ICD-10-CM | POA: Diagnosis not present

## 2015-12-31 DIAGNOSIS — N390 Urinary tract infection, site not specified: Secondary | ICD-10-CM | POA: Diagnosis present

## 2015-12-31 DIAGNOSIS — J189 Pneumonia, unspecified organism: Secondary | ICD-10-CM | POA: Diagnosis present

## 2015-12-31 DIAGNOSIS — E46 Unspecified protein-calorie malnutrition: Secondary | ICD-10-CM | POA: Diagnosis present

## 2015-12-31 DIAGNOSIS — Z7189 Other specified counseling: Secondary | ICD-10-CM | POA: Diagnosis not present

## 2015-12-31 DIAGNOSIS — E876 Hypokalemia: Secondary | ICD-10-CM | POA: Diagnosis present

## 2015-12-31 DIAGNOSIS — Z79899 Other long term (current) drug therapy: Secondary | ICD-10-CM | POA: Diagnosis not present

## 2015-12-31 DIAGNOSIS — F329 Major depressive disorder, single episode, unspecified: Secondary | ICD-10-CM | POA: Diagnosis present

## 2015-12-31 DIAGNOSIS — J9811 Atelectasis: Secondary | ICD-10-CM | POA: Diagnosis present

## 2015-12-31 DIAGNOSIS — R5383 Other fatigue: Secondary | ICD-10-CM | POA: Diagnosis not present

## 2015-12-31 DIAGNOSIS — N183 Chronic kidney disease, stage 3 (moderate): Secondary | ICD-10-CM | POA: Diagnosis present

## 2015-12-31 DIAGNOSIS — R45851 Suicidal ideations: Secondary | ICD-10-CM | POA: Diagnosis present

## 2015-12-31 DIAGNOSIS — Z515 Encounter for palliative care: Secondary | ICD-10-CM | POA: Diagnosis not present

## 2015-12-31 DIAGNOSIS — R296 Repeated falls: Secondary | ICD-10-CM | POA: Diagnosis present

## 2015-12-31 DIAGNOSIS — N179 Acute kidney failure, unspecified: Secondary | ICD-10-CM | POA: Diagnosis present

## 2015-12-31 DIAGNOSIS — R627 Adult failure to thrive: Secondary | ICD-10-CM | POA: Diagnosis present

## 2015-12-31 DIAGNOSIS — G4700 Insomnia, unspecified: Secondary | ICD-10-CM | POA: Diagnosis not present

## 2015-12-31 DIAGNOSIS — E860 Dehydration: Secondary | ICD-10-CM | POA: Diagnosis present

## 2015-12-31 DIAGNOSIS — K219 Gastro-esophageal reflux disease without esophagitis: Secondary | ICD-10-CM | POA: Diagnosis not present

## 2015-12-31 DIAGNOSIS — F0390 Unspecified dementia without behavioral disturbance: Secondary | ICD-10-CM | POA: Diagnosis present

## 2015-12-31 DIAGNOSIS — I482 Chronic atrial fibrillation: Secondary | ICD-10-CM | POA: Diagnosis present

## 2015-12-31 DIAGNOSIS — Z6824 Body mass index (BMI) 24.0-24.9, adult: Secondary | ICD-10-CM | POA: Diagnosis not present

## 2015-12-31 DIAGNOSIS — Z66 Do not resuscitate: Secondary | ICD-10-CM | POA: Diagnosis present

## 2015-12-31 DIAGNOSIS — Z87440 Personal history of urinary (tract) infections: Secondary | ICD-10-CM | POA: Diagnosis not present

## 2015-12-31 DIAGNOSIS — Y95 Nosocomial condition: Secondary | ICD-10-CM | POA: Diagnosis present

## 2015-12-31 LAB — BASIC METABOLIC PANEL
Anion gap: 15 (ref 5–15)
BUN: 18 mg/dL (ref 6–20)
CHLORIDE: 104 mmol/L (ref 101–111)
CO2: 22 mmol/L (ref 22–32)
Calcium: 9 mg/dL (ref 8.9–10.3)
Creatinine, Ser: 1.51 mg/dL — ABNORMAL HIGH (ref 0.44–1.00)
GFR calc non Af Amer: 28 mL/min — ABNORMAL LOW (ref >60)
GFR, EST AFRICAN AMERICAN: 32 mL/min — AB (ref >60)
Glucose, Bld: 86 mg/dL (ref 65–99)
POTASSIUM: 2.7 mmol/L — AB (ref 3.5–5.1)
SODIUM: 141 mmol/L (ref 135–145)

## 2015-12-31 LAB — CBC
HEMATOCRIT: 32 % — AB (ref 36.0–46.0)
Hemoglobin: 9.7 g/dL — ABNORMAL LOW (ref 12.0–15.0)
MCH: 22.8 pg — ABNORMAL LOW (ref 26.0–34.0)
MCHC: 30.3 g/dL (ref 30.0–36.0)
MCV: 75.1 fL — AB (ref 78.0–100.0)
Platelets: 218 10*3/uL (ref 150–400)
RBC: 4.26 MIL/uL (ref 3.87–5.11)
RDW: 20.3 % — ABNORMAL HIGH (ref 11.5–15.5)
WBC: 7.9 10*3/uL (ref 4.0–10.5)

## 2015-12-31 LAB — GLUCOSE, CAPILLARY: Glucose-Capillary: 190 mg/dL — ABNORMAL HIGH (ref 65–99)

## 2015-12-31 LAB — PROTIME-INR
INR: 4.17 — ABNORMAL HIGH (ref 0.00–1.49)
PROTHROMBIN TIME: 39.2 s — AB (ref 11.6–15.2)

## 2015-12-31 LAB — PROCALCITONIN: Procalcitonin: 0.25 ng/mL

## 2015-12-31 MED ORDER — DEXTROSE-NACL 5-0.45 % IV SOLN
INTRAVENOUS | Status: DC
  Administered 2015-12-31 – 2016-01-01 (×4): via INTRAVENOUS

## 2015-12-31 MED ORDER — POTASSIUM CHLORIDE 10 MEQ/100ML IV SOLN
10.0000 meq | INTRAVENOUS | Status: AC
  Administered 2015-12-31 (×6): 10 meq via INTRAVENOUS
  Filled 2015-12-31 (×2): qty 100

## 2015-12-31 MED ORDER — DEXTROSE 5 % IV SOLN
500.0000 mg | Freq: Once | INTRAVENOUS | Status: AC
  Administered 2015-12-31: 500 mg via INTRAVENOUS
  Filled 2015-12-31: qty 500

## 2015-12-31 NOTE — Progress Notes (Signed)
Spoke with patient's HCPOA, Mary Mcclain, her grandson. He states that she is DNR and has discussed this with him before. He feels that patient has decompensated since her UTI last month and that she was not happy living at a SNF.He states her SNF was a great place but patient just wanted to be at home. He is hopeful that she will start eating soon w/ her new psych med remeron so that she can return to the SNF and ultimately come back home once she is stronger. Will change her code status to DNR. Will change her azithromycin to IV since she has been refusing po meds.   Mary Mcclain, Diana, MD Internal Medicine Resident, PGY [facility] Pager: [phone]

## 2015-12-31 NOTE — Progress Notes (Signed)
ANTICOAGULATION CONSULT NOTE - Follow Up Consult  Pharmacy Consult for Coumadin Indication: atrial fibrillation  Allergies  Allergen Reactions  . Sulfa Antibiotics Other (See Comments)    Pt stated it has been so long ago that she didn't remember what happens    Patient Measurements: Height: 5\' 7"  (170.2 cm) Weight: 154 lb 12.2 oz (70.2 kg) IBW/kg (Calculated) : 61.6 Vital Signs: Temp: 98.3 F (36.8 C) (03/19 0645) Temp Source: Oral (03/19 0645) BP: 128/70 mmHg (03/19 0645) Pulse Rate: 76 (03/19 0645) Labs:  Recent Labs  12/29/15 1223 12/29/15 1341 12/30/15 0557 12/31/15 0255 12/31/15 0718  HGB 11.6*  --   --   --  9.7*  HCT 39.0  --   --   --  32.0*  PLT 224  --   --   --  218  LABPROT  --  34.8* 37.4* 39.2*  --   INR  --  3.55* 3.92* 4.17*  --   CREATININE 2.18*  --   --   --  1.51*    Estimated Creatinine Clearance: 20.7 mL/min (by C-G formula based on Cr of 1.51).  Assessment: 82 year old female on chronic Coumadin for Afib.   INR was SUPRA-therapeutic on admission and continues to rise despite holding therapy likely due to poor po intake. Drug-drug interaction noted with start of azithromycin.  Last dose was on 3/16 PTA dose 0.5mg  daily.  Palliative care consult pending.   Goal of Therapy:  INR 2-3 Monitor platelets by anticoagulation protocol: Yes   Plan:  Continue to hold Coumadin.  Daily PT/INR  Link SnufferJessica Kingsley Farace, PharmD, BCPS Clinical Pharmacist [phone]  12/31/2015,11:07 AM

## 2015-12-31 NOTE — Progress Notes (Signed)
Subjective: Mary Mcclain has been refusing oral meds last night except remeron. She opens eyes to voice. Difficult to get patient to interact, denies any pain.   Objective: Vital signs in last 24 hours: Filed Vitals:   12/30/15 0635 12/30/15 1500 12/30/15 2200 12/31/15 0645  BP: 123/58 125/68 125/79 128/70  Pulse: 94 78  76  Temp: 98.9 F (37.2 C) 98 F (36.7 C) 98.2 F (36.8 C) 98.3 F (36.8 C)  TempSrc: Oral Oral Oral Oral  Resp: 18 17  17   Height:      Weight:      SpO2: 99% 99% 97% 98%    Gen: Cachectic-appearing HEENT: dry mucous membranes Arcus senilis present. CV: Normal rate, irregularly irregular rhythm, no murmurs, rubs, or gallops Pulmonary: Normal effort, CTA anteriorly Abdominal: Soft, non-tender, non-distended, without rebound, guarding, or masses Extremities: trace pedal edema Skin: No atypical appearing moles. No rashes  Lab Results: Basic Metabolic Panel:  Recent Labs Lab 12/29/15 1223 12/31/15 0718  NA 140 141  K 3.5 2.7*  CL 97* 104  CO2 21* 22  GLUCOSE 108* 86  BUN 23* 18  CREATININE 2.18* 1.51*  CALCIUM 9.6 9.0   CBC:  Recent Labs Lab 12/29/15 1223 12/31/15 0718  WBC 11.1* 7.9  HGB 11.6* 9.7*  HCT 39.0 32.0*  MCV 74.3* 75.1*  PLT 224 218   Thyroid Function Tests:  Recent Labs Lab 12/29/15 1613  TSH 2.582   Coagulation:  Recent Labs Lab 12/29/15 1341 12/30/15 0557 12/31/15 0255  LABPROT 34.8* 37.4* 39.2*  INR 3.55* 3.92* 4.17*   Urinalysis:  Recent Labs Lab 12/29/15 1226  COLORURINE AMBER*  LABSPEC 1.016  PHURINE 5.5  GLUCOSEU NEGATIVE  HGBUR TRACE*  BILIRUBINUR LARGE*  KETONESUR 15*  PROTEINUR 30*  NITRITE NEGATIVE  LEUKOCYTESUR SMALL*   Studies/Results: Dg Chest 2 View  12/29/2015  CLINICAL DATA:  Chest/back pain.  Altered mental status EXAM: CHEST  2 VIEW COMPARISON:  December 06, 2015 FINDINGS: There is patchy airspace consolidation in the left lower lobe. There is slight scarring in the right mid  lung. Lungs elsewhere clear. Heart remains enlarged with pulmonary vascularity within normal limits. No adenopathy. There is atherosclerotic change in the aorta. There is upper thoracic levoscoliosis with mid and lower thoracic dextroscoliosis. IMPRESSION: Left lower lobe consolidation. Question pneumonia or possibly aspiration pneumonitis in this area. Slight scarring right mid lung. Lungs elsewhere clear. Stable cardiac enlargement. Electronically Signed   By: Bretta BangWilliam  Woodruff III M.D.   On: 12/29/2015 12:40   Assessment/Plan: 1. Generalized weakness and depression - poor PO intake, worsening generalized weakness over several weeks. No overt signs or symptoms of infection, WBC 11. CXR likely atelectasis but possible aspiration pnenomitis or PNA, although much less likely given no symptoms. UA dirty, without urinary symptoms currently. -Palliative care consult for goals of care -Psychiatry following, d/c'd zoloft and started on remeron 7.5mg  qhs -continue azithromycin for possible HCAP, however she refused to take yesterday's dose. Leukocytosis is resolving, and she has been afebrile.   2. AKI - baseline ~1.2, improving, Cr 1.51 today - D5 1/2NS at 100 cc/hr - BMET in the am  Dispo: Disposition is deferred at this time, awaiting improvement of current medical problems.  Anticipated discharge in approximately 1-3 day(s).   The patient does not have a current PCP (No primary care provider on file.) and [facility].  The patient does have transportation limitations that hinder transportation to clinic appointments.  Denton Brick, MD 12/31/2015, 8:29 AM

## 2015-12-31 NOTE — Care Management Obs Status (Signed)
MEDICARE OBSERVATION STATUS NOTIFICATION   Patient Details  Name: Mary Mcclain MRN: [id number] Date of Birth: 08/13/1917   Medicare Observation Status Notification Given:  Other (see comment) (Notice left with patient, attempted to explain, pt cannot understand)    Mary Mcclain, Mary Sima Harris, RN 12/31/2015, 10:39 AM

## 2016-01-01 DIAGNOSIS — E876 Hypokalemia: Secondary | ICD-10-CM

## 2016-01-01 DIAGNOSIS — R5383 Other fatigue: Secondary | ICD-10-CM

## 2016-01-01 DIAGNOSIS — E8809 Other disorders of plasma-protein metabolism, not elsewhere classified: Secondary | ICD-10-CM

## 2016-01-01 DIAGNOSIS — J189 Pneumonia, unspecified organism: Principal | ICD-10-CM

## 2016-01-01 DIAGNOSIS — E46 Unspecified protein-calorie malnutrition: Secondary | ICD-10-CM

## 2016-01-01 LAB — BASIC METABOLIC PANEL
ANION GAP: 10 (ref 5–15)
BUN: 12 mg/dL (ref 6–20)
CHLORIDE: 103 mmol/L (ref 101–111)
CO2: 23 mmol/L (ref 22–32)
Calcium: 8.7 mg/dL — ABNORMAL LOW (ref 8.9–10.3)
Creatinine, Ser: 1.1 mg/dL — ABNORMAL HIGH (ref 0.44–1.00)
GFR calc Af Amer: 47 mL/min — ABNORMAL LOW (ref >60)
GFR, EST NON AFRICAN AMERICAN: 41 mL/min — AB (ref >60)
GLUCOSE: 162 mg/dL — AB (ref 65–99)
POTASSIUM: 2.7 mmol/L — AB (ref 3.5–5.1)
SODIUM: 136 mmol/L (ref 135–145)

## 2016-01-01 LAB — MAGNESIUM: MAGNESIUM: 1.2 mg/dL — AB (ref 1.7–2.4)

## 2016-01-01 LAB — PROTIME-INR
INR: 3.79 — ABNORMAL HIGH (ref 0.00–1.49)
Prothrombin Time: 36.5 seconds — ABNORMAL HIGH (ref 11.6–15.2)

## 2016-01-01 MED ORDER — MAGNESIUM SULFATE 2 GM/50ML IV SOLN
2.0000 g | Freq: Once | INTRAVENOUS | Status: AC
  Administered 2016-01-01: 2 g via INTRAVENOUS
  Filled 2016-01-01: qty 50

## 2016-01-01 MED ORDER — DEXTROSE 5 % IV SOLN
250.0000 mg | INTRAVENOUS | Status: DC
  Administered 2016-01-01: 250 mg via INTRAVENOUS
  Filled 2016-01-01 (×2): qty 250

## 2016-01-01 MED ORDER — POTASSIUM CHLORIDE 10 MEQ/100ML IV SOLN
10.0000 meq | INTRAVENOUS | Status: AC
  Administered 2016-01-01 (×6): 10 meq via INTRAVENOUS
  Filled 2016-01-01 (×3): qty 100

## 2016-01-01 NOTE — Clinical Documentation Improvement (Signed)
Internal Medicine  Abnormal Lab/Test Results:   Component     Latest Ref Rng 12/29/2015 12/31/2015 01/01/2016  Potassium     3.5 - 5.1 mmol/L 3.5 2.7 (LL) 2.7 (LL)    Possible Clinical Conditions associated with below indicators  Hypokalemia  Other Condition  Cannot Clinically Determine   Treatment Provided: 01/01/16  Potassium Chloride 10 mEq in 100 ml IV 1 every hr x 6   Please exercise your independent, professional judgment when responding. A specific answer is not anticipated or expected. Please update your documentation within the medical record to reflect your response to this query. Thank you  Thank Barrie DunkerYou,  Skyann Ganim C Lulabelle Desta [facility] Information Management [place] [phone]

## 2016-01-01 NOTE — Progress Notes (Signed)
CRITICAL VALUE ALERT  Critical value received: K+ -2.7  Date of notification:  01/01/2016  Time of notification:0510  Critical value read back:Yes.    Nurse who received alert: Arbutus LeasSondra Tilman  MD notified (1st page): Dr. Karma GreaserBoswell  Time of first page: 0515  MD notified (2nd page):  Time of second page:  Responding MD:  Dr. Karma GreaserBoswell  Time MD [phone]:0515

## 2016-01-01 NOTE — Progress Notes (Signed)
   Subjective: Mary Mcclain had no acute events overnight. She is slightly more conversant this morning. She is taking remeron but otherwise refuses others meds. She is uninterested in eating still, but does want to go back to her SNF.  Objective: Vital signs in last 24 hours: Filed Vitals:   12/31/15 0645 12/31/15 1330 12/31/15 2120 01/01/16 0610  BP: 128/70 132/68 121/67 133/78  Pulse: 76 76 100 108  Temp: 98.3 F (36.8 C) 98 F (36.7 C) 98 F (36.7 C) 97.6 F (36.4 C)  TempSrc: Oral Oral Oral   Resp: 17 18 16 15   Height:      Weight:      SpO2: 98% 98% 94% 93%    Gen: Cachectic-appearing HEENT: dry mucous membranes Arcus senilis present. CV: Normal rate, irregularly irregular rhythm, no murmurs, rubs, or gallops Pulmonary: Normal effort, CTA anteriorly Abdominal: Soft, non-tender, non-distended, without rebound, guarding, or masses Extremities: trace pedal edema Skin: No atypical appearing moles. No rashes  Lab Results: Basic Metabolic Panel:  Recent Labs Lab 12/31/15 0718 01/01/16 0343  NA 141 136  K 2.7* 2.7*  CL 104 103  CO2 22 23  GLUCOSE 86 162*  BUN 18 12  CREATININE 1.51* 1.10*  CALCIUM 9.0 8.7*  MG  --  1.2*   CBC:  Recent Labs Lab 12/29/15 1223 12/31/15 0718  WBC 11.1* 7.9  HGB 11.6* 9.7*  HCT 39.0 32.0*  MCV 74.3* 75.1*  PLT 224 218   Thyroid Function Tests:  Recent Labs Lab 12/29/15 1613  TSH 2.582   Coagulation:  Recent Labs Lab 12/29/15 1341 12/30/15 0557 12/31/15 0255 01/01/16 0343  LABPROT 34.8* 37.4* 39.2* 36.5*  INR 3.55* 3.92* 4.17* 3.79*   Urinalysis:  Recent Labs Lab 12/29/15 1226  COLORURINE AMBER*  LABSPEC 1.016  PHURINE 5.5  GLUCOSEU NEGATIVE  HGBUR TRACE*  BILIRUBINUR LARGE*  KETONESUR 15*  PROTEINUR 30*  NITRITE NEGATIVE  LEUKOCYTESUR SMALL*   Studies/Results: No results found. Assessment/Plan: 1. Generalized weakness and depression - poor PO intake, worsening generalized weakness over several  weeks. No overt signs or symptoms of infection, WBC 11. CXR likely atelectasis but possible aspiration pnenomitis or PNA, although much less likely given no symptoms. UA dirty, without urinary symptoms currently. -Palliative care consult for goals of care -Psychiatry following, d/c'd zoloft and started on remeron 7.5mg  qhs -S/p Azithromycin x 3 days for presumed HCAP -Replace electrolytes PRN  2. AKI - resolved; at baseline -IVNS  Dispo: Disposition is deferred at this time, awaiting improvement of current medical problems.  Anticipated discharge in approximately 1-3 day(s).   The patient does not have a current PCP (No primary care provider on file.) and [facility] after discharge.  The patient does have transportation limitations that hinder transportation to clinic appointments.  Mary HuntsmanWilliam R Adiana Smelcer, MD 01/01/2016, 10:16 AM

## 2016-01-01 NOTE — Progress Notes (Signed)
ANTICOAGULATION CONSULT NOTE - Follow Up Consult  Pharmacy Consult:  Coumadin Indication: atrial fibrillation  Allergies  Allergen Reactions  . Sulfa Antibiotics Other (See Comments)    Pt stated it has been so long ago that she didn't remember what happens    Patient Measurements: Height: 5\' 7"  (170.2 cm) Weight: 154 lb 12.2 oz (70.2 kg) IBW/kg (Calculated) : 61.6   Vital Signs: Temp: 97.6 F (36.4 C) (03/20 0610) BP: 133/78 mmHg (03/20 0610) Pulse Rate: 108 (03/20 0610) Labs:  Recent Labs  12/30/15 0557 12/31/15 0255 12/31/15 0718 01/01/16 0343  HGB  --   --  9.7*  --   HCT  --   --  32.0*  --   PLT  --   --  218  --   LABPROT 37.4* 39.2*  --  36.5*  INR 3.92* 4.17*  --  3.79*  CREATININE  --   --  1.51* 1.10*    Estimated Creatinine Clearance: 28.4 mL/min (by C-G formula based on Cr of 1.1).    Assessment: [age] YOF with history of Afib to continue on Coumadin from PTA.  Patient's INR was elevated on admission, then trended up and now improving but still supra-therapeutic.  No bleeding reported.   Goal of Therapy:  INR 2-3    Plan:  - Continue to hold Coumadin - Daily PT / INR - Watch CBGs - Consider resuming home med Zoloft   Arley Garant D. Laney Potashang, PharmD, BCPS Pager:  [phone] 01/01/2016, 1:17 PM

## 2016-01-02 DIAGNOSIS — Z7189 Other specified counseling: Secondary | ICD-10-CM | POA: Insufficient documentation

## 2016-01-02 DIAGNOSIS — K219 Gastro-esophageal reflux disease without esophagitis: Secondary | ICD-10-CM

## 2016-01-02 DIAGNOSIS — Z515 Encounter for palliative care: Secondary | ICD-10-CM | POA: Insufficient documentation

## 2016-01-02 LAB — BASIC METABOLIC PANEL
ANION GAP: 9 (ref 5–15)
BUN: 6 mg/dL (ref 6–20)
CALCIUM: 8.7 mg/dL — AB (ref 8.9–10.3)
CO2: 23 mmol/L (ref 22–32)
CREATININE: 0.97 mg/dL (ref 0.44–1.00)
Chloride: 103 mmol/L (ref 101–111)
GFR, EST AFRICAN AMERICAN: 55 mL/min — AB (ref >60)
GFR, EST NON AFRICAN AMERICAN: 47 mL/min — AB (ref >60)
Glucose, Bld: 158 mg/dL — ABNORMAL HIGH (ref 65–99)
Potassium: 2.9 mmol/L — ABNORMAL LOW (ref 3.5–5.1)
Sodium: 135 mmol/L (ref 135–145)

## 2016-01-02 LAB — PROTIME-INR
INR: 2.67 — AB (ref 0.00–1.49)
PROTHROMBIN TIME: 28.1 s — AB (ref 11.6–15.2)

## 2016-01-02 LAB — PROCALCITONIN: PROCALCITONIN: 0.11 ng/mL

## 2016-01-02 MED ORDER — MIRTAZAPINE 7.5 MG PO TABS: 7.5000 mg | ORAL_TABLET | Freq: Every day | ORAL | Status: DC

## 2016-01-02 MED ORDER — CALCIUM CARBONATE ANTACID 500 MG PO CHEW
1.0000 | CHEWABLE_TABLET | Freq: Three times a day (TID) | ORAL | Status: DC
  Administered 2016-01-02 (×2): 200 mg via ORAL
  Filled 2016-01-02 (×2): qty 1

## 2016-01-02 MED ORDER — WARFARIN 0.5 MG HALF TABLET
0.5000 mg | ORAL_TABLET | Freq: Once | ORAL | Status: AC
  Administered 2016-01-02: 0.5 mg via ORAL
  Filled 2016-01-02: qty 1

## 2016-01-02 NOTE — Progress Notes (Signed)
   Subjective: [name] had no acute events overnight. She is much more alert and conversant this morning, up in bed. She is now eating, and wants to go back to her SNF.  Objective: Vital signs in last 24 hours: Filed Vitals:   01/01/16 0610 01/01/16 1434 01/01/16 2101 01/02/16 0652  BP: 133/78 116/81 118/74 120/76  Pulse: 108 70 72 68  Temp: 97.6 F (36.4 C) 97 F (36.1 C) 97.3 F (36.3 C) 97.9 F (36.6 C)  TempSrc:  Oral Oral Oral  Resp: 15 16 16 17   Height:      Weight:      SpO2: 93% 95% 96% 97%    Gen: Cachectic-appearing HEENT: dry mucous membranes Arcus senilis present. CV: Normal rate, irregularly irregular rhythm, no murmurs, rubs, or gallops Pulmonary: Normal effort, CTA anteriorly Abdominal: Soft, non-tender, non-distended, without rebound, guarding, or masses Extremities: trace pedal edema Skin: No atypical appearing moles. No rashes  Lab Results: Basic Metabolic Panel:  Recent Labs Lab 01/01/16 0343 01/02/16 0422  NA 136 135  K 2.7* 2.9*  CL 103 103  CO2 23 23  GLUCOSE 162* 158*  BUN 12 6  CREATININE 1.10* 0.97  CALCIUM 8.7* 8.7*  MG 1.2*  --    CBC:  Recent Labs Lab 12/29/15 1223 12/31/15 0718  WBC 11.1* 7.9  HGB 11.6* 9.7*  HCT 39.0 32.0*  MCV 74.3* 75.1*  PLT 224 218   Thyroid Function Tests:  Recent Labs Lab 12/29/15 1613  TSH 2.582   Coagulation:  Recent Labs Lab 12/30/15 0557 12/31/15 0255 01/01/16 0343 01/02/16 0422  LABPROT 37.4* 39.2* 36.5* 28.1*  INR 3.92* 4.17* 3.79* 2.67*   Urinalysis:  Recent Labs Lab 12/29/15 1226  COLORURINE AMBER*  LABSPEC 1.016  PHURINE 5.5  GLUCOSEU NEGATIVE  HGBUR TRACE*  BILIRUBINUR LARGE*  KETONESUR 15*  PROTEINUR 30*  NITRITE NEGATIVE  LEUKOCYTESUR SMALL*   Studies/Results: No results found. Assessment/Plan: 1. Generalized weakness and depression - poor PO intake, worsening generalized weakness over several weeks. No overt signs or symptoms of infection, WBC 11. CXR  likely atelectasis but possible aspiration pnenomitis or PNA, although much less likely given no symptoms. UA dirty, without urinary symptoms currently. -Palliative care consult for goals of care - patient states that she does not want to come back to the hospital in the setting that she does become more ill. Will work developing a good [employer] plan for the SNF when she returns, likely this afternoon. -Psychiatry following, d/c'd zoloft and started on remeron 7.5mg  qhs -S/p Azithromycin x 3 days for presumed HCAP -Replace electrolytes PRN  2. AKI - resolved; at baseline -IVNS  Dispo: Disposition is deferred at this time, awaiting improvement of current medical problems.  Anticipated discharge this afternoon.  The patient does not have a current PCP (No primary care provider on file.) and [facility] after discharge.  The patient does have transportation limitations that hinder transportation to clinic appointments.  Darrick HuntsmanWilliam R Kacper Cartlidge, MD 01/02/2016, 10:33 AM

## 2016-01-02 NOTE — Clinical Social Work Note (Signed)
Clinical Social Worker facilitated patient discharge including contacting patient family and facility to confirm patient discharge plans.  Clinical information faxed to facility and family agreeable with plan.  [facility]  RN to call report prior to discharge.  Clinical Social Worker will sign off for now as social work intervention is no longer needed. Please consult us again if new need arises.  Macario GoldsJesse Kemper Hochman, Kentucky[place]CS[place] [phone]

## 2016-01-02 NOTE — Discharge Instructions (Signed)

## 2016-01-02 NOTE — Progress Notes (Signed)
Nutrition Brief Note  Chart reviewed. Pt now transitioning to comfort care.  No further nutrition interventions warranted at this time.  Please re-consult as needed.   Heylee Tant A. Kazuo Durnil, RD, LDN, CDE Pager: [phone] After hours Pager: [phone]  

## 2016-01-02 NOTE — Progress Notes (Signed)
ANTICOAGULATION CONSULT NOTE - Follow Up Consult  Pharmacy Consult:  Coumadin Indication: atrial fibrillation  Allergies  Allergen Reactions  . Sulfa Antibiotics Other (See Comments)    Pt stated it has been so long ago that she didn't remember what happens    Patient Measurements: Height: 5\' 7"  (170.2 cm) Weight: 154 lb 12.2 oz (70.2 kg) IBW/kg (Calculated) : 61.6   Vital Signs: Temp: 97.9 F (36.6 C) (03/21 0652) Temp Source: Oral (03/21 0652) BP: 120/76 mmHg (03/21 0652) Pulse Rate: 68 (03/21 0652) Labs:  Recent Labs  12/31/15 0255 12/31/15 0718 01/01/16 0343 01/02/16 0422  HGB  --  9.7*  --   --   HCT  --  32.0*  --   --   PLT  --  218  --   --   LABPROT 39.2*  --  36.5* 28.1*  INR 4.17*  --  3.79* 2.67*  CREATININE  --  1.51* 1.10* 0.97    Estimated Creatinine Clearance: 32.2 mL/min (by C-G formula based on Cr of 0.97).    Assessment: [age] YOF with history of Afib to continue on Coumadin from PTA.  Patient's INR was elevated on admission and has now decreased to therapeutic range.  No bleeding reported.  Her intake remains poor and she is currently on azithromycin.   Goal of Therapy:  INR 2-3    Plan:  - Coumadin 0.5mg  PO today - Daily PT / INR - Consider resuming home med Zoloft    Geraldine Tesar D. Laney Potashang, PharmD, BCPS Pager:  [phone] 01/02/2016, 12:47 PM

## 2016-01-02 NOTE — Consult Note (Signed)
Consultation Note Date: 01/02/2016   Patient Name: Mary Mcclain  DOB: [birth year]  MRN: [id number]  Age / Sex: 82 y.o., female  PCP: No primary care provider on file. Referring Physician: Tyson Alias, MD  Reason for Consultation: Establishing goals of care  Ms. Giacobbe is a 82 year old female with atrial fibrillation and chronic kidney disease, who was presented to the ED on 12/29/2015 with nonspecific symptoms and was admitted for evaluation and treatment for suspected lower lobar pneumonia.   Clinical Assessment/Narrative: Since admission, Ms. Fitch has completed her course of antibiotics and her spirits and mental status are much improved. She is the primary decision maker in her medical care with a positive and realistic outlook on her immediate future. Although she does not like her current residential facility and is not looking forward to returning there, she is eager to get out of the hospital and move forward with the things she has to do. Ms. Geffert is chair/bed bound, although she does feed herself. During our meeting this morning, Ms. Buhrman completed a MOST form with her grandson, Dario Guardian, and endorsed minimal medical intervention and no returns to the hospital. Patient is not expected to live more than one calendar year. Shs has had recurrent urinary tract infections,  battled with severe depression lately and has had several falls in the past few months. She is currently DNR.  Contacts/Participants in Discussion: Grandson, Mr. Kizzie Bane, and the patient. Primary Decision Maker: Patient  SUMMARY OF RECOMMENDATIONS  Code Status/Advance Care Planning: DNR    Code Status Orders        Start     Ordered   12/31/15 1254  Do not attempt resuscitation (DNR)   Continuous    Question Answer Comment  In the event of cardiac or respiratory ARREST Do not call a "code blue"   In the event of  cardiac or respiratory ARREST Do not perform Intubation, CPR, defibrillation or ACLS   In the event of cardiac or respiratory ARREST Use medication by any route, position, wound care, and other measures to relive pain and suffering. May use oxygen, suction and manual treatment of airway obstruction as needed for comfort.      12/31/15 1253    Code Status History    Date Active Date Inactive Code Status Order ID Comments User Context   12/06/2015  4:51 PM 12/07/2015  6:12 PM Full Code [id number]  Yolanda Manges, DO ED      Other Directives:MOST Form original left on chart  Symptom Management:   PPI, H2 blocker, Tums daily for GERD  Acetaminophen as needed for pain  Chloraseptic as needed for throat irritation/pain   Psycho-social/Spiritual:  Support System: Adequate Desire for further Chaplaincy support:NO Additional Recommendations: Hospice consult.  Family would like to speak with [facility] hospice.  Prognosis: Less than 6 months; given advanced age, decreased oral intake,  Bed/chair bound status, recurrent UTIs, Failure to thrive, recent falls.   Patient does not want acute care.  No return to the hospital.  She wants to be made comfortable where she is.  Discharge Planning: Assisted living facility - [facility] with Hospice evaluation.   Chief Complaint/ Primary Diagnoses: Present on Admission:  . HCAP (healthcare-associated pneumonia) . Major depression, chronic (HCC)  I have reviewed the medical record, interviewed the patient and family, and examined the patient. The following aspects are pertinent.  Past Medical History  Diagnosis Date  . Chronic kidney disease (CKD)   . Atrial fibrillation (HCC)   .  Anemia    Social History   Social History  . Marital Status: Widowed    Spouse Name: N/A  . Number of Children: N/A  . Years of Education: N/A   Social History Main Topics  . Smoking status: Former Games developermoker  . Smokeless tobacco: Never Used  . Alcohol Use: No    . Drug Use: No  . Sexual Activity: No   Other Topics Concern  . None   Social History Narrative   No family history on file. Scheduled Meds: . azithromycin  250 mg Intravenous Q24H  . calcium carbonate  1 tablet Oral TID WC  . cholecalciferol  1,000 Units Oral Daily  . feeding supplement (ENSURE ENLIVE)  237 mL Oral BID  . mirtazapine  7.5 mg Oral QHS  . pantoprazole  40 mg Oral Daily  . Warfarin - Pharmacist Dosing Inpatient   Does not apply q1800   Continuous Infusions:  PRN Meds:.acetaminophen, phenol Medications Prior to Admission:  Prior to Admission medications   Medication Sig Start Date End Date Taking? Authorizing Provider  cholecalciferol (VITAMIN D) 1000 units tablet Take 1,000 Units by mouth daily.   Yes Historical Provider, MD  ciprofloxacin (CIPRO) 250 MG tablet Take 250 mg by mouth 2 (two) times daily. For 7 days   Yes Historical Provider, MD  ENSURE (ENSURE) Take 237 mLs by mouth 2 (two) times daily.   Yes Historical Provider, MD  pantoprazole (PROTONIX) 40 MG tablet Take 40 mg by mouth daily. 12/01/15  Yes Historical Provider, MD  sertraline (ZOLOFT) 25 MG tablet Take 37.5 mg by mouth daily.  12/01/15  Yes Historical Provider, MD  warfarin (COUMADIN) 1 MG tablet Take 0.5 mg by mouth daily.   Yes Historical Provider, MD  acetaminophen (TYLENOL) 325 MG tablet Take 650 mg by mouth every 4 (four) hours as needed for mild pain.    Historical Provider, MD  Amino Acids-Protein Hydrolys (FEEDING SUPPLEMENT, PRO-STAT SUGAR FREE 64,) LIQD Take 30 mLs by mouth 2 (two) times daily.    Historical Provider, MD  cephALEXin (KEFLEX) 250 MG capsule Take 1 capsule (250 mg total) by mouth every 8 (eight) hours. 12/07/15   Darreld McleanVishal Patel, MD  warfarin (COUMADIN) 2 MG tablet Take 2 mg by mouth every Tuesday, Thursday, Saturday, and Sunday at 6 PM.    Historical Provider, MD  warfarin (COUMADIN) 2.5 MG tablet Take 2.5 mg by mouth every Monday, Wednesday, and Friday.    Historical Provider,  MD   Allergies  Allergen Reactions  . Sulfa Antibiotics Other (See Comments)    Pt stated it has been so long ago that she didn't remember what happens    Review of Systems  Constitutional: Negative.   HENT: Negative.   Eyes: Negative.   Respiratory: Negative.   Cardiovascular: Negative.   Gastrointestinal:       Reflux   Endocrine: Negative.   Genitourinary: Negative.   Musculoskeletal: Negative.   Skin: Negative.   Allergic/Immunologic: Negative.   Neurological: Negative.   Hematological: Negative.   Psychiatric/Behavioral: Negative.   All other systems reviewed and are negative.   Physical Exam  Constitutional: She is oriented to person, place, and time. She appears well-developed.  HENT:  Head: Normocephalic and atraumatic.  Eyes: Conjunctivae and EOM are normal. Pupils are equal, round, and reactive to light.  Neck: Normal range of motion.  Cardiovascular: Normal rate and normal heart sounds.   Respiratory: Effort normal and breath sounds normal.  GI: Soft. Bowel sounds are normal.  Musculoskeletal:  4+ pitting edema in dependent areas  Neurological: She is alert and oriented to person, place, and time.  Skin: Skin is warm and dry.  Psychiatric: She has a normal mood and affect.    Vital Signs: BP 120/76 mmHg  Pulse 68  Temp(Src) 97.9 F (36.6 C) (Oral)  Resp 17  Ht  (1.702 m)  Wt 70.2 kg (154 lb 12.2 oz)  BMI 24.23 kg/m2  SpO2 97%  SpO2: SpO2: 97 % O2 Device:SpO2: 97 % O2 Flow Rate: .O2 Flow Rate (L/min): 2 L/min  IO: Intake/output summary:  Intake/Output Summary (Last 24 hours) at 01/02/16 1235 Last data filed at 01/02/16 0920  Gross per 24 hour  Intake   2255 ml  Output      0 ml  Net   2255 ml    LBM:   Baseline Weight: Weight: 77.111 kg (170 lb) Most recent weight: Weight: 70.2 kg (154 lb 12.2 oz)      Palliative Assessment/Data:  Flowsheet Rows        Most Recent Value   Intake Tab    Referral Department  -- [internal  medicine]   Unit at Time of Referral  ER   Palliative Care Primary Diagnosis  Neurology   Date Notified  12/29/15   Palliative Care Type  New Palliative care   Reason for referral  Clarify Goals of Care   Date of Admission  12/29/15   # of days IP prior to Palliative referral  0   Clinical Assessment    Psychosocial & Spiritual Assessment    Palliative Care Outcomes       Additional Data Reviewed:  CBC:    Component Value Date/Time   WBC 7.9 12/31/2015 0718   HGB 9.7* 12/31/2015 0718   HCT 32.0* 12/31/2015 0718   PLT 218 12/31/2015 0718   MCV 75.1* 12/31/2015 0718   NEUTROABS 7.5 12/06/2015 1156   LYMPHSABS 1.0 12/06/2015 1156   MONOABS 0.7 12/06/2015 1156   EOSABS 0.2 12/06/2015 1156   BASOSABS 0.0 12/06/2015 1156   Comprehensive Metabolic Panel:    Component Value Date/Time   NA 135 01/02/2016 0422   K 2.9* 01/02/2016 0422   CL 103 01/02/2016 0422   CO2 23 01/02/2016 0422   BUN 6 01/02/2016 0422   CREATININE 0.97 01/02/2016 0422   GLUCOSE 158* 01/02/2016 0422   CALCIUM 8.7* 01/02/2016 0422   AST 17 12/06/2015 1156   ALT 10* 12/06/2015 1156   ALKPHOS 58 12/06/2015 1156   BILITOT 0.3 12/06/2015 1156   PROT 6.8 12/06/2015 1156   ALBUMIN 3.1* 12/06/2015 1156     Time In: 11:30 Time Out: 12:40 Time Total: 70 minutes Greater than 50%  of this time was spent counseling and coordinating care related to the above assessment and plan.  Signed by: Mariana Single, Sheppard Penton, PA-C Palliative Medicine Pager: [phone]  01/02/2016, 12:35 PM  Please contact Palliative Medicine Team phone at [phone] for questions and concerns.

## 2016-01-02 NOTE — [place] FL2 (Signed)
[place] MEDICAID FL2 LEVEL OF CARE SCREENING TOOL     IDENTIFICATION  Patient Name: Mary Mcclain Birthdate: 08/24/1917 Sex: female Admission Date (Current Location): 12/29/2015  [facility] [place]   Facility and Address:  [facility], [address], [place], [place] [address]      Provider Number: [id number]  Attending Physician Name and Address:  Tyson Aliasuncan Thomas Vincent, MD  Relative Name and Phone Number:       Current Level of Care: Hospital Recommended Level of Care: Assisted Living Facility Prior Approval Number:    Date Approved/Denied:   PASRR Number:    Discharge Plan: Other (Comment) (Assisted Living with Hospice Following)    Current Diagnoses: Patient Active Problem List   Diagnosis Date Noted  . Palliative care encounter   . Goals of care, counseling/discussion   . Encounter for hospice care discussion   . Gastroesophageal reflux disease without esophagitis   . HCAP (healthcare-associated pneumonia) 12/29/2015  . AKI (acute kidney injury) (HCC) 12/29/2015  . Pressure ulcer 12/29/2015  . Acute encephalopathy 12/06/2015  . UTI (urinary tract infection) 12/06/2015  . CKD (chronic kidney disease) 12/06/2015  . Chronic atrial fibrillation (HCC) 12/06/2015  . Acute cystitis without hematuria   . Stage 3 chronic kidney disease due to arterionephrosclerosis   . Major depression, chronic (HCC)   . Aortic atherosclerosis (HCC)     Orientation RESPIRATION BLADDER Height & Weight     Self  Normal Incontinent Weight: 154 lb 12.2 oz (70.2 kg) Height:  5\' 7"  (170.2 cm)  BEHAVIORAL SYMPTOMS/MOOD NEUROLOGICAL BOWEL NUTRITION STATUS      Continent Diet (Heart Healthy / Thin Liquids)  AMBULATORY STATUS COMMUNICATION OF NEEDS Skin   Extensive Assist Verbally PU Stage and Appropriate Care   PU Stage 2 Dressing:  (PRN - Sacrum)                   Personal Care Assistance Level of Assistance  Bathing, Feeding,  Dressing Bathing Assistance: Maximum assistance Feeding assistance: Maximum assistance Dressing Assistance: Maximum assistance     Functional Limitations Info  Sight, Hearing, Speech Sight Info: Adequate Hearing Info: Adequate Speech Info: Adequate    SPECIAL CARE FACTORS FREQUENCY   (Hospice Following)                    Contractures Contractures Info: Not present    Additional Factors Info  Code Status, Allergies, Psychotropic Code Status Info: DNR Allergies Info: Sulfa Antibiotics Psychotropic Info: Remeron         Current Medications (01/02/2016):  This is the current hospital active medication list Current Facility-Administered Medications  Medication Dose Route Frequency Provider Last Rate Last Dose  . acetaminophen (TYLENOL) tablet 650 mg  650 mg Oral Q4H PRN Denton Brickiana M Truong, MD      . azithromycin [facility](ZITHROMAX) 250 mg in dextrose 5 % 125 mL IVPB  250 mg Intravenous Q24H Denton Brickiana M Truong, MD   250 mg at 01/01/16 1555  . calcium carbonate (TUMS - dosed in mg elemental calcium) chewable tablet 200 mg of elemental calcium  1 tablet Oral TID WC Marianne L York, PA-C   200 mg of elemental calcium at 01/02/16 1242  . cholecalciferol (VITAMIN D) tablet 1,000 Units  1,000 Units Oral Daily Denton Brickiana M Truong, MD   1,000 Units at 01/02/16 1009  . feeding supplement (ENSURE ENLIVE) (ENSURE ENLIVE) liquid 237 mL  237 mL Oral BID Denton Brickiana M Truong, MD  237 mL at 01/02/16 1009  . mirtazapine (REMERON) tablet 7.5 mg  7.5 mg Oral QHS Mojeed Akintayo, MD   7.5 mg at 01/01/16 2209  . pantoprazole (PROTONIX) EC tablet 40 mg  40 mg Oral Daily Denton Brick, MD   40 mg at 01/02/16 1009  . phenol (CHLORASEPTIC) mouth spray 1 spray  1 spray Mouth/Throat PRN Darreld Mclean, MD      . warfarin (COUMADIN) tablet 0.5 mg  0.5 mg Oral ONCE-1800 Gerilyn Nestle, RPH      . Warfarin - Pharmacist Dosing Inpatient   Does not apply q1800 Marquita Palms, [facility]   Stopped at 12/30/15 1800     Discharge Medications:   acetaminophen 325 MG tablet  Commonly known as: TYLENOL  Take 650 mg by mouth every 4 (four) hours as needed for mild pain.     cephALEXin 250 MG capsule  Commonly known as: KEFLEX  Take 1 capsule (250 mg total) by mouth every 8 (eight) hours.     cholecalciferol 1000 units tablet  Commonly known as: VITAMIN D  Take 1,000 Units by mouth daily.     ciprofloxacin 250 MG tablet  Commonly known as: CIPRO  Take 250 mg by mouth 2 (two) times daily. For 7 days     ENSURE  Take 237 mLs by mouth 2 (two) times daily.     feeding supplement (PRO-STAT SUGAR FREE 64) Liqd  Take 30 mLs by mouth 2 (two) times daily.     pantoprazole 40 MG tablet  Commonly known as: PROTONIX  Take 40 mg by mouth daily.     sertraline 25 MG tablet  Commonly known as: ZOLOFT  Take 37.5 mg by mouth daily.     warfarin 1 MG tablet  Commonly known as: COUMADIN  Take 0.5 mg by mouth daily.     warfarin 2.5 MG tablet  Commonly known as: COUMADIN  Take 2.5 mg by mouth every Monday, Wednesday, and Friday.     warfarin 2 MG tablet  Commonly known as: COUMADIN  Take 2 mg by mouth every Tuesday, Thursday, Saturday, and Sunday at 6 PM.             Relevant Imaging Results:  Relevant Lab Results:   Additional Information    Macario Golds, [place] [phone]

## 2016-01-02 NOTE — Discharge Summary (Signed)
Name: Mary Mcclain MRN: [id number] DOB: 04/05/1917 82 y.o. PCP: No primary care provider on file.  Date of Admission: 12/29/2015 10:52 AM Date of Discharge: 01/02/2016 Attending Physician: Tyson Aliasuncan Thomas Vincent, MD  Discharge Diagnosis: 1. Major depression  Principal Problem:   Major depression, chronic (HCC) Active Problems:   HCAP (healthcare-associated pneumonia)   AKI (acute kidney injury) (HCC)   Pressure ulcer   Palliative care encounter   Goals of care, counseling/discussion   Encounter for hospice care discussion   Gastroesophageal reflux disease without esophagitis  Discharge Medications:   Medication List    STOP taking these medications        cephALEXin 250 MG capsule  Commonly known as:  KEFLEX     ciprofloxacin 250 MG tablet  Commonly known as:  CIPRO     sertraline 25 MG tablet  Commonly known as:  ZOLOFT      TAKE these medications        acetaminophen 325 MG tablet  Commonly known as:  TYLENOL  Take 650 mg by mouth every 4 (four) hours as needed for mild pain.     cholecalciferol 1000 units tablet  Commonly known as:  VITAMIN D  Take 1,000 Units by mouth daily.     ENSURE  Take 237 mLs by mouth 2 (two) times daily.     feeding supplement (PRO-STAT SUGAR FREE 64) Liqd  Take 30 mLs by mouth 2 (two) times daily.     mirtazapine 7.5 MG tablet  Commonly known as:  REMERON  Take 1 tablet (7.5 mg total) by mouth at bedtime.     pantoprazole 40 MG tablet  Commonly known as:  PROTONIX  Take 40 mg by mouth daily.     warfarin 1 MG tablet  Commonly known as:  COUMADIN  Take 0.5 mg by mouth daily.        Disposition and follow-up:   [name] [facility] Good [facility].  At the hospital follow up visit please address:  1.  Nutritional status, mood symptoms  2.  Labs / imaging needed at time of follow-up: None  3.  Pending labs/ test needing follow-up: None  Follow-up Appointments:  None   Discharge Instructions: Discharge Instructions    Diet - low sodium heart healthy    Complete by:  As directed      Increase activity slowly    Complete by:  As directed            Consultations:    Procedures Performed:  Ct Abdomen Pelvis Wo Contrast  12/06/2015  CLINICAL DATA:  Larey SeatFell several days ago. Low back and abdominal pain. Weakness and confusion. EXAM: CT ABDOMEN AND PELVIS WITHOUT CONTRAST TECHNIQUE: Multidetector CT imaging of the abdomen and pelvis was performed following the standard protocol without IV contrast. COMPARISON:  Lumbar radiography 12/03/2006 2017. FINDINGS: There is bronchiectasis in the left lower lobe with some atelectasis and possible mild patchy infiltrate. Right lung bases clear. No pleural fluid. There is a hiatal hernia The liver is normal except for a 1.9 cm cyst or hemangioma within the medial segment of the left lobe. Previous cholecystectomy. The spleen is normal. The pancreas is normal with some atrophic change. The adrenal glands are normal. The left kidney contains a 2 mm stone in the lower pole. There are a few small cysts. No hydronephrosis. The right kidney contains 2 mm stone in the midportion. Several cysts are present, the largest measuring 2.5 cm  in the midportion. There is atherosclerotic change of the aorta but no aneurysm. The IVC is normal. No retroperitoneal mass or adenopathy. No free intraperitoneal fluid or air. No acute bowel pathology. There is a ventral hernia containing mesenteric fat. There is diverticulosis without evidence of diverticulitis. The bladder appears normal. There is a uterine mass most consistent with leiomyoma measuring about 4 x 5 cm. No adnexal lesion. There is curvature in chronic degenerative change of the spine. No evidence of spinal fracture or pelvic fracture. IMPRESSION: No acute finding by noncontrast CT. Bronchiectasis in the left lower lobe with some volume loss and possible mild patchy infiltrate. A hiatal  hernia. Ventral abdominal hernia containing only fat. Atherosclerosis of the aorta and its branch vessels without aneurysm. Liver cyst and renal cysts. Diverticulosis without evidence of diverticulitis. Electronically Signed   By: Paulina Fusi M.D.   On: 12/06/2015 16:30   Dg Chest 2 View  12/29/2015  CLINICAL DATA:  Chest/back pain.  Altered mental status EXAM: CHEST  2 VIEW COMPARISON:  December 06, 2015 FINDINGS: There is patchy airspace consolidation in the left lower lobe. There is slight scarring in the right mid lung. Lungs elsewhere clear. Heart remains enlarged with pulmonary vascularity within normal limits. No adenopathy. There is atherosclerotic change in the aorta. There is upper thoracic levoscoliosis with mid and lower thoracic dextroscoliosis. IMPRESSION: Left lower lobe consolidation. Question pneumonia or possibly aspiration pneumonitis in this area. Slight scarring right mid lung. Lungs elsewhere clear. Stable cardiac enlargement. Electronically Signed   By: Bretta Bang III M.D.   On: 12/29/2015 12:40   Dg Chest 2 View  12/06/2015  CLINICAL DATA:  82 year old female with weakness and confusion. Initial encounter. EXAM: CHEST  2 VIEW COMPARISON:  02/23/2008. FINDINGS: Semi upright AP and lateral views of the chest. Mild to moderate cardiomegaly has increased since 2009. Other mediastinal contours remain within normal limits. Lower lung volumes. No pneumothorax, pulmonary edema, pleural effusion or consolidation. Patchy 11 mm lung nodule in the right lung projecting at the minor fissure. Osteopenia. Calcified aortic atherosclerosis. IMPRESSION: 1. Cardiomegaly has developed since 2009.  Lower lung volumes. 2. Right mid lung 11 mm pulmonary nodule is new, but might be inflammatory or due to scarring. In this setting, lower followup PA and lateral chest X-ray could be performed in 3 months to evaluate persistence. Electronically Signed   By: Odessa Fleming M.D.   On: 12/06/2015 11:38   Dg  Lumbar Spine Complete  12/04/2015  CLINICAL DATA:  82 year old nursing home patient who had an unwitnessed fall around 4 o'clock p.m., complaining of severe low back pain. EXAM: LUMBAR SPINE - COMPLETE 4+ VIEW COMPARISON:  None. FINDINGS: Five non rib-bearing lumbar vertebrae with S1 representing a transitional segment, having a well defined disc space between it and S2. Anatomic alignment. Mild compression fracture of the upper endplate of L1 on the order of 20% or so which does not appear acute. No fractures elsewhere. Moderate to severe degenerative disc disease and spondylosis at every lumbar level, worst at L2-3 and L4-5. No pars defects. Facet degenerative changes bilaterally at L4-5 and L5-S1. Sacroiliac joints intact. Degenerative changes involving the visualized hip joints. Aortoiliac atherosclerosis without aneurysm. IMPRESSION: 1. No acute osseous abnormality. 2. Old mild compression fracture of the upper endplate of L1 on the order of 20% or so. 3. Moderate to severe multilevel degenerative disc disease and spondylosis, worst at L2-3 and L4-5. Bilateral facet degenerative changes at L4-5 and L5-S1. Electronically Signed  By: Hulan Saas M.D.   On: 12/04/2015 20:57   Ct Head Wo Contrast  12/04/2015  CLINICAL DATA:  82 year old female post unwitnessed fall today. No loss of consciousness. EXAM: CT HEAD WITHOUT CONTRAST CT CERVICAL SPINE WITHOUT CONTRAST TECHNIQUE: Multidetector CT imaging of the head and cervical spine was performed following the standard protocol without intravenous contrast. Multiplanar CT image reconstructions of the cervical spine were also generated. COMPARISON:  None. FINDINGS: CT HEAD FINDINGS The generalized atrophy and moderate to advanced chronic small vessel ischemic change. No intracranial hemorrhage, mass effect, or midline shift. No hydrocephalus. The basilar cisterns are patent. No evidence of territorial infarct. No intracranial fluid collection. Calvarium is  intact. Included paranasal sinuses and mastoid air cells are well aerated. CT CERVICAL SPINE FINDINGS There is no fracture. The dens is intact. There are no jumped or perched facets. Disc space narrowing from C4-C5 through C6-C7 with endplate spurring. Scattered facet arthropathy. No prevertebral soft tissue edema. IMPRESSION: 1. Atrophy and chronic small vessel ischemia, no acute intracranial abnormality. 2. Degenerative change in the cervical spine without acute fracture or subluxation. Electronically Signed   By: Rubye Oaks M.D.   On: 12/04/2015 21:15   Ct Cervical Spine Wo Contrast  12/04/2015  CLINICAL DATA:  82 year old female post unwitnessed fall today. No loss of consciousness. EXAM: CT HEAD WITHOUT CONTRAST CT CERVICAL SPINE WITHOUT CONTRAST TECHNIQUE: Multidetector CT imaging of the head and cervical spine was performed following the standard protocol without intravenous contrast. Multiplanar CT image reconstructions of the cervical spine were also generated. COMPARISON:  None. FINDINGS: CT HEAD FINDINGS The generalized atrophy and moderate to advanced chronic small vessel ischemic change. No intracranial hemorrhage, mass effect, or midline shift. No hydrocephalus. The basilar cisterns are patent. No evidence of territorial infarct. No intracranial fluid collection. Calvarium is intact. Included paranasal sinuses and mastoid air cells are well aerated. CT CERVICAL SPINE FINDINGS There is no fracture. The dens is intact. There are no jumped or perched facets. Disc space narrowing from C4-C5 through C6-C7 with endplate spurring. Scattered facet arthropathy. No prevertebral soft tissue edema. IMPRESSION: 1. Atrophy and chronic small vessel ischemia, no acute intracranial abnormality. 2. Degenerative change in the cervical spine without acute fracture or subluxation. Electronically Signed   By: Rubye Oaks M.D.   On: 12/04/2015 21:15   Dg Hip Unilat With Pelvis 2-3 Views Right  12/04/2015   CLINICAL DATA:  Unwitnessed fall around 4 p.m. today. EXAM: DG HIP (WITH OR WITHOUT PELVIS) 2-3V RIGHT COMPARISON:  None. FINDINGS: Frontal pelvis shows no fracture. SI joints and symphysis pubis are unremarkable. AP and frog-leg lateral views of the right hip show no evidence for right femoral neck fracture. IMPRESSION: Negative. Electronically Signed   By: Kennith Center M.D.   On: 12/04/2015 20:56    2D Echo: None  Cardiac Cath: None  Admission HPI: Mrs. Willingham is a 82yo with dementia, Atrial fibrillation on coumadin, CKD stage 3, depression, and recent admission for confusion due to UTI who presented [facility] The patient is uninterested in speaking, says "I don't know, I don't care anymore" when asked questions. Per the nursing home, she has been refusing to eat, generally weaker over the past several weeks. She denies any other specific complaints, including fevers, confusion, falls, headache, vision changes, chest pain, cough, shortness of breath, abdominal pain, N/V/D, constipation, urinary symptoms or other issues at this time. She was admitted for confusion 2/2 UTI last month, sent on  keflex. Apparently, she has been receiving ciprofloxacin at the nursing home for a UTI as well. She denies HI, but does have passive suicidal ideation.  Hospital Course by problem list: Principal Problem:   Major depression, chronic (HCC) Active Problems:   HCAP (healthcare-associated pneumonia)   AKI (acute kidney injury) (HCC)   Pressure ulcer   Palliative care encounter   Goals of care, counseling/discussion   Encounter for hospice care discussion   Gastroesophageal reflux disease without esophagitis   1. Major depression - patient initially was withdrawn, uninterested in conversation, and refusing all oral medications and food. She was given IV hydration, and psychiatry as well as palliative care were consulted. She did not meet criteria for involuntary  psychiatric admission. Her SSRI was switched to mirtazapine. Over the course of the next few days, her mood and energy improved greatly. Palliative helped facilitate a goals of care discussion with her, in which a MOST form was filled, and she clearly outlined that she did not want to return to the hospital but would accept antibiotics, IV fluids, or other non-invasive measures. She was deemed well to return to [facility], with [facility] evaluation to be performed there.  Discharge Vitals:   BP 120/76 mmHg  Pulse 68  Temp(Src) 97.9 F (36.6 C) (Oral)  Resp 17  Ht  (1.702 m)  Wt 154 lb 12.2 oz (70.2 kg)  BMI 24.23 kg/m2  SpO2 97%  Discharge Labs:  Results for orders placed or performed during the hospital encounter of 12/29/15 (from the past 24 hour(s))  Protime-INR     Status: Abnormal   Collection Time: 01/02/16  4:22 AM  Result Value Ref Range   Prothrombin Time 28.1 (H) 11.6 - 15.2 seconds   INR 2.67 (H) 0.00 - 1.49  Procalcitonin     Status: None   Collection Time: 01/02/16  4:22 AM  Result Value Ref Range   Procalcitonin 0.11 ng/mL  Basic metabolic panel     Status: Abnormal   Collection Time: 01/02/16  4:22 AM  Result Value Ref Range   Sodium 135 135 - 145 mmol/L   Potassium 2.9 (L) 3.5 - 5.1 mmol/L   Chloride 103 101 - 111 mmol/L   CO2 23 22 - 32 mmol/L   Glucose, Bld 158 (H) 65 - 99 mg/dL   BUN 6 6 - 20 mg/dL   Creatinine, Ser 9.60 0.44 - 1.00 mg/dL   Calcium 8.7 (L) 8.9 - 10.3 mg/dL   GFR calc non Af Amer 47 (L) >60 mL/min   GFR calc Af Amer 55 (L) >60 mL/min   Anion gap 9 5 - 15    Signed: Darrick Huntsman, MD 01/02/2016, 4:01 PM    Services Ordered on Discharge: Hospice evaluation at ALF Equipment Ordered on Discharge: None

## 2016-01-02 NOTE — [place] FL2 (Signed)
[place] MEDICAID FL2 LEVEL OF CARE SCREENING TOOL     IDENTIFICATION  Patient Name: Mary Mcclain Birthdate: 08-Jul-1917 Sex: female Admission Date (Current Location): 12/29/2015  [facility] and [place] Number:  Producer, television/film/video and Address:  [facility] [place], [address] [place], [place] [address]      Provider Number: [id number]  Attending Physician Name and Address:  Tyson Alias, MD  Relative Name and Phone Number:       Current Level of Care: Hospital Recommended Level of Care: Assisted Living Facility Prior Approval Number:    Date Approved/Denied:   PASRR Number:    Discharge Plan: Other (Comment) (Assisted Living with Hospice Following)    Current Diagnoses: Patient Active Problem List   Diagnosis Date Noted  . Palliative care encounter   . Goals of care, counseling/discussion   . Encounter for hospice care discussion   . Gastroesophageal reflux disease without esophagitis   . HCAP (healthcare-associated pneumonia) 12/29/2015  . AKI (acute kidney injury) (HCC) 12/29/2015  . Pressure ulcer 12/29/2015  . Acute encephalopathy 12/06/2015  . UTI (urinary tract infection) 12/06/2015  . CKD (chronic kidney disease) 12/06/2015  . Chronic atrial fibrillation (HCC) 12/06/2015  . Acute cystitis without hematuria   . Stage 3 chronic kidney disease due to arterionephrosclerosis   . Major depression, chronic (HCC)   . Aortic atherosclerosis (HCC)     Orientation RESPIRATION BLADDER Height & Weight     Self  Normal Incontinent Weight: 154 lb 12.2 oz (70.2 kg) Height:   (170.2 cm)  BEHAVIORAL SYMPTOMS/MOOD NEUROLOGICAL BOWEL NUTRITION STATUS      Continent Diet NAS  AMBULATORY STATUS COMMUNICATION OF NEEDS Skin   Extensive Assist Verbally PU Stage and Appropriate Care   PU Stage 2 Dressing:  (PRN - Sacrum) - Home Health Care RN to address and provide dressing as needed                   Personal Care Assistance Level of  Assistance  Bathing, Feeding, Dressing Bathing Assistance: Maximum assistance Feeding assistance: Maximum assistance Dressing Assistance: Maximum assistance     Functional Limitations Info  Sight, Hearing, Speech Sight Info: Adequate Hearing Info: Adequate Speech Info: Adequate    SPECIAL CARE FACTORS FREQUENCY   (Hospice Following)                    Contractures Contractures Info: Not present    Additional Factors Info  Code Status, Allergies, Psychotropic Code Status Info: DNR Allergies Info: Sulfa Antibiotics Psychotropic Info: Remeron         Current Medications (01/02/2016):  This is the current hospital active medication list Current Facility-Administered Medications  Medication Dose Route Frequency Provider Last Rate Last Dose  . acetaminophen (TYLENOL) tablet 650 mg  650 mg Oral Q4H PRN Denton Brick, MD      . azithromycin [facility]) 250 mg in dextrose 5 % 125 mL IVPB  250 mg Intravenous Q24H Denton Brick, MD   250 mg at 01/01/16 1555  . calcium carbonate (TUMS - dosed in mg elemental calcium) chewable tablet 200 mg of elemental calcium  1 tablet Oral TID WC Marianne L York, PA-C   200 mg of elemental calcium at 01/02/16 1242  . cholecalciferol (VITAMIN D) tablet 1,000 Units  1,000 Units Oral Daily Denton Brick, MD   1,000 Units at 01/02/16 1009  . feeding supplement (ENSURE ENLIVE) (ENSURE ENLIVE) liquid 237 mL  237  mL Oral BID Denton Brickiana M Truong, MD   237 mL at 01/02/16 1009  . mirtazapine (REMERON) tablet 7.5 mg  7.5 mg Oral QHS Mojeed Akintayo, MD   7.5 mg at 01/01/16 2209  . pantoprazole (PROTONIX) EC tablet 40 mg  40 mg Oral Daily Denton Brickiana M Truong, MD   40 mg at 01/02/16 1009  . phenol (CHLORASEPTIC) mouth spray 1 spray  1 spray Mouth/Throat PRN Darreld McleanVishal Patel, MD      . warfarin (COUMADIN) tablet 0.5 mg  0.5 mg Oral ONCE-1800 Gerilyn Nestlehuy D Dang, RPH      . Warfarin - Pharmacist Dosing Inpatient   Does not apply q1800 Marquita PalmsCorey M Ball, [facility]   Stopped at 12/30/15 1800      Discharge Medications: acetaminophen 325 MG tablet  Commonly known as: TYLENOL  Take 650 mg by mouth every 4 (four) hours as needed for mild pain.                        cholecalciferol 1000 units tablet  Commonly known as: VITAMIN D  Take 1,000 Units by mouth daily.  Last time this was given: 1,000 Units on 01/02/2016 10:09 AM                       ENSURE  Take 237 mLs by mouth 2 (two) times daily.  Last time this was given: 237 mLs on 01/02/2016 10:09 AM                       feeding supplement (PRO-STAT SUGAR FREE 64) Liqd  Take 30 mLs by mouth 2 (two) times daily.                       mirtazapine 7.5 MG tablet  Commonly known as: REMERON  Take 1 tablet (7.5 mg total) by mouth at bedtime.  Last time this was given: 7.5 mg on 01/01/2016 10:09 PM  For: [employer]ouble Sleeping, Major Depressive Disorder, poor appetite                       pantoprazole 40 MG tablet  Commonly known as: PROTONIX  Take 40 mg by mouth daily.  Last time this was given: 40 mg on 01/02/2016 10:09 AM                       warfarin 1 MG tablet  Commonly known as: COUMADIN  Take 0.5 mg by mouth daily.  What changed: Another medication with the same name was removed. Continue taking this medication, and follow the directions              Relevant Imaging Results:  Relevant Lab Results:   Additional Information    Macario GoldsJesse Yunis [place], [place] [phone]

## 2016-01-02 NOTE — Care Management Note (Addendum)
Case Management Note  Patient Details  Name: Mary SchimkeLouise Ehle MRN: [id number] Date of Birth: 06/28/1917  Subjective/Objective:                    Action/Plan:  [facility] .  Called SW regarding returning to ALF .   Patient asleep , grandson not in room at present. Called Dario GuardianDonovan Hughes ( grandson ) [phone].  [facility] [place] : [address] , [place] , [place] [address] phone [phone] .   Offered choice to Mr Kizzie BaneHughes , he does want [facility] .  Left voice mail for Bambi at [facility]. Awaiting call back.  Spoke with Bambi , referral faxed to [facility] at [phone]  [facility] spoke with Tresa EndoKelly who transferred call to nursing , left voice mail . Tresa EndoKelly will let nursing know CM called.  Expected Discharge Date:                  Expected Discharge Plan:  Assisted Living / Rest Home  In-House Referral:  Clinical Social Work  Discharge planning Services  CM Consult  Post Acute Care Choice:  Hospice Choice offered to:     DME Arranged:    DME Agency:     HH Arranged:    HH Agency:     Status of Service:     Medicare Important Message Given:    Date Medicare IM Given:    Medicare IM give by:    Date Additional Medicare IM Given:    Additional Medicare Important Message give by:     If discussed at Long Length of Stay Meetings, dates discussed:    Additional Comments:  Kingsley PlanWile, Kindrick Lankford Marie, RN 01/02/2016, 2:32 PM

## 2016-01-02 NOTE — Progress Notes (Signed)
[facility]

## 2016-01-02 NOTE — Progress Notes (Signed)
Patient for [facility] awaiting [facility], report given to nurse Tiburcio PeaHarris.

## 2016-02-25 ENCOUNTER — Emergency Department (HOSPITAL_COMMUNITY)
Admission: EM | Admit: 2016-02-25 | Discharge: 2016-02-25 | Disposition: A | Discharge status: Home / Self Care | Attending: Emergency Medicine | Admitting: Emergency Medicine

## 2016-02-25 ENCOUNTER — Encounter (HOSPITAL_COMMUNITY): Payer: Self-pay | Admitting: Emergency Medicine

## 2016-02-25 DIAGNOSIS — N189 Chronic kidney disease, unspecified: Secondary | ICD-10-CM | POA: Insufficient documentation

## 2016-02-25 DIAGNOSIS — E872 Acidosis, unspecified: Secondary | ICD-10-CM

## 2016-02-25 DIAGNOSIS — Z7952 Long term (current) use of systemic steroids: Secondary | ICD-10-CM | POA: Insufficient documentation

## 2016-02-25 DIAGNOSIS — E860 Dehydration: Secondary | ICD-10-CM | POA: Insufficient documentation

## 2016-02-25 DIAGNOSIS — Z7901 Long term (current) use of anticoagulants: Secondary | ICD-10-CM | POA: Insufficient documentation

## 2016-02-25 DIAGNOSIS — Z87891 Personal history of nicotine dependence: Secondary | ICD-10-CM | POA: Insufficient documentation

## 2016-02-25 DIAGNOSIS — R000 Tachycardia, unspecified: Secondary | ICD-10-CM | POA: Insufficient documentation

## 2016-02-25 DIAGNOSIS — Z862 Personal history of diseases of the blood and blood-forming organs and certain disorders involving the immune mechanism: Secondary | ICD-10-CM | POA: Insufficient documentation

## 2016-02-25 DIAGNOSIS — K922 Gastrointestinal hemorrhage, unspecified: Secondary | ICD-10-CM

## 2016-02-25 DIAGNOSIS — Z8679 Personal history of other diseases of the circulatory system: Secondary | ICD-10-CM | POA: Insufficient documentation

## 2016-02-25 DIAGNOSIS — Z79899 Other long term (current) drug therapy: Secondary | ICD-10-CM | POA: Insufficient documentation

## 2016-02-25 HISTORY — DX: Gastro-esophageal reflux disease without esophagitis: K21.9

## 2016-02-25 LAB — I-STAT CG4 LACTIC ACID, ED
Lactic Acid, Venous: 6.28 mmol/L (ref 0.5–2.0)
Lactic Acid, Venous: 5.79 mmol/L (ref 0.5–2.0)

## 2016-02-25 LAB — COMPREHENSIVE METABOLIC PANEL
ALT: 14 U/L (ref 14–54)
AST: 31 U/L (ref 15–41)
Albumin: 2.8 g/dL — ABNORMAL LOW (ref 3.5–5.0)
Alkaline Phosphatase: 67 U/L (ref 38–126)
Anion gap: 33 — ABNORMAL HIGH (ref 5–15)
BUN: 20 mg/dL (ref 6–20)
CO2: 15 mmol/L — ABNORMAL LOW (ref 22–32)
Calcium: 10.9 mg/dL — ABNORMAL HIGH (ref 8.9–10.3)
Chloride: 93 mmol/L — ABNORMAL LOW (ref 101–111)
Creatinine, Ser: 2.65 mg/dL — ABNORMAL HIGH (ref 0.44–1.00)
GFR calc Af Amer: 16 mL/min — ABNORMAL LOW (ref >60)
GFR calc non Af Amer: 14 mL/min — ABNORMAL LOW (ref >60)
Glucose, Bld: 242 mg/dL — ABNORMAL HIGH (ref 65–99)
Potassium: 3.1 mmol/L — ABNORMAL LOW (ref 3.5–5.1)
Sodium: 141 mmol/L (ref 135–145)
Total Bilirubin: 1.8 mg/dL — ABNORMAL HIGH (ref 0.3–1.2)
Total Protein: 7.2 g/dL (ref 6.5–8.1)

## 2016-02-25 LAB — CBC
HCT: 48.8 % — ABNORMAL HIGH (ref 36.0–46.0)
Hemoglobin: 15.1 g/dL — ABNORMAL HIGH (ref 12.0–15.0)
MCH: 25.6 pg — ABNORMAL LOW (ref 26.0–34.0)
MCHC: 30.9 g/dL (ref 30.0–36.0)
MCV: 82.7 fL (ref 78.0–100.0)
Platelets: 165 10*3/uL (ref 150–400)
RBC: 5.9 MIL/uL — ABNORMAL HIGH (ref 3.87–5.11)
RDW: 26.8 % — ABNORMAL HIGH (ref 11.5–15.5)
WBC: 10.1 10*3/uL (ref 4.0–10.5)

## 2016-02-25 LAB — TYPE AND SCREEN
ABO/RH(D): AB POS
Antibody Screen: NEGATIVE

## 2016-02-25 LAB — PROTIME-INR
INR: 1.06 (ref 0.00–1.49)
Prothrombin Time: 14 seconds (ref 11.6–15.2)

## 2016-02-25 LAB — ABO/RH: ABO/RH(D): AB POS

## 2016-02-25 MED ORDER — SODIUM CHLORIDE 0.9 % IV SOLN
80.0000 mg | Freq: Once | INTRAVENOUS | Status: AC
  Administered 2016-02-25: 80 mg via INTRAVENOUS
  Filled 2016-02-25: qty 80

## 2016-02-25 MED ORDER — SODIUM CHLORIDE 0.9 % IV BOLUS (SEPSIS)
1000.0000 mL | Freq: Once | INTRAVENOUS | Status: AC
  Administered 2016-02-25: 1000 mL via INTRAVENOUS

## 2016-02-25 MED ORDER — VITAMIN K1 10 MG/ML IJ SOLN
10.0000 mg | Freq: Once | INTRAMUSCULAR | Status: AC
  Administered 2016-02-25: 10 mg via INTRAVENOUS
  Filled 2016-02-25: qty 1

## 2016-02-25 MED ORDER — ONDANSETRON HCL 4 MG/2ML IJ SOLN
4.0000 mg | Freq: Once | INTRAMUSCULAR | Status: AC
  Administered 2016-02-25: 4 mg via INTRAVENOUS
  Filled 2016-02-25: qty 2

## 2016-02-25 NOTE — ED Provider Notes (Signed)
CSN: [id number]     Arrival date & time 02/25/16  1131 History   First MD Initiated Contact with Patient 02/25/16 1225     Chief Complaint  Patient presents with  . Emesis  . Abdominal Pain  . Back Pain  . Hip Pain     (Consider location/radiation/quality/duration/timing/severity/associated sxs/prior Treatment) HPI   82 year old female brought in from [facility] with brown/black emesis which reportedly started today. Patient herself is unable to give much useful history other than history some very simple questions. Hypotensive for EMS initial blood pressure 70/50. Blood pressure increased with small amount of saline. No reported fever. No diarrhea.  Past Medical History  Diagnosis Date  . Chronic kidney disease (CKD)   . Atrial fibrillation (HCC)   . Anemia   . GERD (gastroesophageal reflux disease)    History reviewed. No pertinent past surgical history. No family history on file. Social History  Substance Use Topics  . Smoking status: Former Games developer  . Smokeless tobacco: Never Used  . Alcohol Use: No   OB History    No data available     Review of Systems  Level V caveat because of acuity of illness.  Allergies  Sulfa antibiotics  Home Medications   Prior to Admission medications   Medication Sig Start Date End Date Taking? Authorizing Provider  acetaminophen (TYLENOL) 325 MG tablet Take 650 mg by mouth every 4 (four) hours as needed for mild pain.   Yes Historical Provider, MD  bisacodyl (DULCOLAX) 10 MG suppository Place 10 mg rectally as needed for mild constipation or moderate constipation.   Yes Historical Provider, MD  ENSURE (ENSURE) Take 237 mLs by mouth 2 (two) times daily.   Yes Historical Provider, MD  hydrocortisone cream 1 % Apply 1 application topically 2 (two) times daily. Apply to back   Yes Historical Provider, MD  LORazepam (ATIVAN) 2 MG/ML concentrated solution Take 0.5 mg by mouth every 12 (twelve) hours as needed for anxiety.    Yes Historical Provider, MD  Menthol-Zinc Oxide 0.44-20.625 % OINT Apply 1 application topically 2 (two) times daily. To buttocks   Yes Historical Provider, MD  mirtazapine (REMERON) 30 MG tablet Take 30 mg by mouth at bedtime.   Yes Historical Provider, MD  morphine (ROXANOL) 20 MG/ML concentrated solution Take 0.25 mg by mouth every 6 (six) hours as needed for severe pain.   Yes Historical Provider, MD  OXYGEN Inhale 2 L into the lungs daily. Continuously   Yes Historical Provider, MD  warfarin (COUMADIN) 1 MG tablet Take 0.5-1 mg by mouth at bedtime. Take 0.5mg  every Sun / Tue / Thurs / Sat Take  every Mon / Wed / Fri   Yes Historical Provider, MD   BP 124/80 mmHg  Pulse 113  Resp 18  SpO2 92% Physical Exam  Constitutional: She appears well-developed.  Laying in bed. Frail and chronically ill appearing. Drowsy but opens eyes to voice and answers simple questions.   HENT:  Head: Normocephalic and atraumatic.  Dark brown/black material noted on lips and tongue  Eyes: Conjunctivae are normal. Right eye exhibits no discharge. Left eye exhibits no discharge.  Neck: Neck supple.  Cardiovascular: Regular rhythm and normal heart sounds.  Exam reveals no gallop and no friction rub.   No murmur heard. tachycardic  Pulmonary/Chest: Effort normal and breath sounds normal. No respiratory distress.  Abdominal: Soft. She exhibits no distension. There is tenderness.  Epigastric and LUQ tenderness  Musculoskeletal: She exhibits no edema  or tenderness.  Neurological: She is alert.  Skin: Skin is warm and dry.  Psychiatric: Thought content normal.  Nursing note and vitals reviewed.   ED Course  Procedures (including critical care time) Labs Review Labs Reviewed  COMPREHENSIVE METABOLIC PANEL - Abnormal; Notable for the following:    Potassium 3.1 (*)    Chloride 93 (*)    CO2 15 (*)    Glucose, Bld 242 (*)    Creatinine, Ser 2.65 (*)    Calcium 10.9 (*)    Albumin 2.8 (*)    Total  Bilirubin 1.8 (*)    GFR calc non Af Amer 14 (*)    GFR calc Af Amer 16 (*)    Anion gap 33 (*)    All other components within normal limits  CBC - Abnormal; Notable for the following:    RBC 5.90 (*)    Hemoglobin 15.1 (*)    HCT 48.8 (*)    MCH 25.6 (*)    RDW 26.8 (*)    All other components within normal limits  I-STAT CG4 LACTIC ACID, ED - Abnormal; Notable for the following:    Lactic Acid, Venous 6.28 (*)    All other components within normal limits  I-STAT CG4 LACTIC ACID, ED - Abnormal; Notable for the following:    Lactic Acid, Venous 5.79 (*)    All other components within normal limits  PROTIME-INR  POC OCCULT BLOOD, ED  TYPE AND SCREEN  ABO/RH    Imaging Review No results found. I have personally reviewed and evaluated these images and lab results as part of my medical decision-making.   EKG Interpretation None      MDM   Final diagnoses:  Lactic acidosis  Acute upper GI bleed  Dehydration    82 year old female with hematemesis. She is critically ill. Recent hospitalization with palliative care consultation. Her wishes at that time was to be DO NOT RESUSCITATE and she also did not even want further hospitalization. She is very sick and somewhat encephalopathic but she still states that she does not want to come in to the hospital. She states that she would like to go back to [place].  We initially began treatments but this will be discontinued per her wishes and she will be discharged back to her facility. Her home medication list includes Ativan and morphine as needed. Her life expectancy is now likely numbered in hours to days.     Raeford RazorStephen Miamor Ayler, MD 03/06/16 2155

## 2016-02-25 NOTE — Discharge Instructions (Signed)
Mary Mcclain is extremely sick. She is very dehydrated and likely has an upper GI bleed. During hospitalization two months ago she expressed wishes to be DNR and not be hospitalized. She is again making these wishes known. She is critically ill but she is being discharged per her goals of care. Please continue to use morphine and ativan as needed for pain and anxiety.   Dehydration, Adult Dehydration is a condition in which you do not have enough fluid or water in your body. It happens when you take in less fluid than you lose. Vital organs such as the kidneys, brain, and heart cannot function without a proper amount of fluids. Any loss of fluids from the body can cause dehydration.  Dehydration can range from mild to severe. This condition should be treated right away to help prevent it from becoming severe. CAUSES  This condition may be caused by:  Vomiting.  Diarrhea.  Excessive sweating, such as when exercising in hot or humid weather.  Not drinking enough fluid during strenuous exercise or during an illness.  Excessive urine output.  Fever.  Certain medicines. RISK FACTORS This condition is more likely to develop in:  People who are taking certain medicines that cause the body to lose excess fluid (diuretics).   People who have a chronic illness, such as diabetes, that may increase urination.  Older adults.   People who live at high altitudes.   People who participate in endurance sports.  SYMPTOMS  Mild Dehydration  Thirst.  Dry lips.  Slightly dry mouth.  Dry, warm skin. Moderate Dehydration  Very dry mouth.   Muscle cramps.   Dark urine and decreased urine production.   Decreased tear production.   Headache.   Light-headedness, especially when you stand up from a sitting position.  Severe Dehydration  Changes in skin.   Cold and clammy skin.   Skin does not spring back quickly when lightly pinched and released.   Changes in body  fluids.   Extreme thirst.   No tears.   Not able to sweat when body temperature is high, such as in hot weather.   Minimal urine production.   Changes in vital signs.   Rapid, weak pulse (more than 100 beats per minute when you are sitting still).   Rapid breathing.   Low blood pressure.   Other changes.   Sunken eyes.   Cold hands and feet.   Confusion.  Lethargy and difficulty being awakened.  Fainting (syncope).   Short-term weight loss.   Unconsciousness. DIAGNOSIS  This condition may be diagnosed based on your symptoms. You may also have tests to determine how severe your dehydration is. These tests may include:   Urine tests.   Blood tests.  TREATMENT  Treatment for this condition depends on the severity. Mild or moderate dehydration can often be treated at home. Treatment should be started right away. Do not wait until dehydration becomes severe. Severe dehydration needs to be treated at the hospital. Treatment for Mild Dehydration  Drinking plenty of water to replace the fluid you have lost.   Replacing minerals in your blood (electrolytes) that you may have lost.  Treatment for Moderate Dehydration  Consuming oral rehydration solution (ORS). Treatment for Severe Dehydration  Receiving fluid through an IV tube.   Receiving electrolyte solution through a feeding tube that is passed through your nose and into your stomach (nasogastric tube or NG tube).  Correcting any abnormalities in electrolytes. HOME CARE INSTRUCTIONS   Drink enough  fluid to keep your urine clear or pale yellow.   Drink water or fluid slowly by taking small sips. You can also try sucking on ice cubes.  Have food or beverages that contain electrolytes. Examples include bananas and sports drinks.  Take over-the-counter and prescription medicines only as told by your health care provider.   Prepare ORS according to the manufacturer's instructions. Take  sips of ORS every 5 minutes until your urine returns to normal.  If you have vomiting or diarrhea, continue to try to drink water, ORS, or both.   If you have diarrhea, avoid:   Beverages that contain caffeine.   Fruit juice.   Milk.   Carbonated soft drinks.  Do not take salt tablets. This can lead to the condition of having too much sodium in your body (hypernatremia).  SEEK MEDICAL CARE IF:  You cannot eat or drink without vomiting.  You have had moderate diarrhea during a period of more than 24 hours.  You have a fever. SEEK IMMEDIATE MEDICAL CARE IF:   You have extreme thirst.  You have severe diarrhea.  You have not urinated in 6-8 hours, or you have urinated only a small amount of very dark urine.  You have shriveled skin.  You are dizzy, confused, or both.   This information is not intended to replace advice given to you by your health care provider. Make sure you discuss any questions you have with your health care provider.   Document Released: 09/30/2005 Document Revised: 06/21/2015 Document Reviewed: 02/15/2015 Elsevier Interactive Patient Education [employer]2016 Elsevier Inc.

## 2016-02-25 NOTE — ED Notes (Signed)
Received [facility] from [facility] with c/o coffee ground emesis x 1 today. Pt also c/o abdominal pain with palpation. Pt initial BP for EMS was 70/50 after 100 ML of NSS pt bp increased to 90/60.

## 2016-02-26 ENCOUNTER — Encounter (HOSPITAL_COMMUNITY): Payer: Self-pay | Admitting: *Deleted

## 2016-02-26 ENCOUNTER — Inpatient Hospital Stay (HOSPITAL_COMMUNITY)
Admission: EM | Admit: 2016-02-26 | Discharge: 2016-02-29 | DRG: 683 | Disposition: A | Discharge status: Hospice | Payer: Medicare Other | Attending: Internal Medicine | Admitting: Internal Medicine

## 2016-02-26 DIAGNOSIS — K219 Gastro-esophageal reflux disease without esophagitis: Secondary | ICD-10-CM | POA: Diagnosis present

## 2016-02-26 DIAGNOSIS — N179 Acute kidney failure, unspecified: Principal | ICD-10-CM | POA: Diagnosis present

## 2016-02-26 DIAGNOSIS — I482 Chronic atrial fibrillation, unspecified: Secondary | ICD-10-CM | POA: Diagnosis present

## 2016-02-26 DIAGNOSIS — E869 Volume depletion, unspecified: Secondary | ICD-10-CM | POA: Diagnosis present

## 2016-02-26 DIAGNOSIS — K920 Hematemesis: Secondary | ICD-10-CM | POA: Clinically undetermined

## 2016-02-26 DIAGNOSIS — E872 Acidosis: Secondary | ICD-10-CM | POA: Diagnosis present

## 2016-02-26 DIAGNOSIS — Z882 Allergy status to sulfonamides status: Secondary | ICD-10-CM

## 2016-02-26 DIAGNOSIS — I959 Hypotension, unspecified: Secondary | ICD-10-CM | POA: Diagnosis present

## 2016-02-26 DIAGNOSIS — Z515 Encounter for palliative care: Secondary | ICD-10-CM

## 2016-02-26 DIAGNOSIS — E876 Hypokalemia: Secondary | ICD-10-CM | POA: Diagnosis present

## 2016-02-26 DIAGNOSIS — N183 Chronic kidney disease, stage 3 (moderate): Secondary | ICD-10-CM | POA: Diagnosis present

## 2016-02-26 DIAGNOSIS — E860 Dehydration: Secondary | ICD-10-CM | POA: Diagnosis present

## 2016-02-26 DIAGNOSIS — R7989 Other specified abnormal findings of blood chemistry: Secondary | ICD-10-CM | POA: Diagnosis present

## 2016-02-26 DIAGNOSIS — R627 Adult failure to thrive: Secondary | ICD-10-CM | POA: Diagnosis present

## 2016-02-26 DIAGNOSIS — R000 Tachycardia, unspecified: Secondary | ICD-10-CM | POA: Diagnosis present

## 2016-02-26 DIAGNOSIS — Z9981 Dependence on supplemental oxygen: Secondary | ICD-10-CM

## 2016-02-26 DIAGNOSIS — Z7901 Long term (current) use of anticoagulants: Secondary | ICD-10-CM

## 2016-02-26 DIAGNOSIS — Z79899 Other long term (current) drug therapy: Secondary | ICD-10-CM

## 2016-02-26 DIAGNOSIS — I129 Hypertensive chronic kidney disease with stage 1 through stage 4 chronic kidney disease, or unspecified chronic kidney disease: Secondary | ICD-10-CM | POA: Diagnosis present

## 2016-02-26 DIAGNOSIS — R1110 Vomiting, unspecified: Secondary | ICD-10-CM | POA: Diagnosis present

## 2016-02-26 DIAGNOSIS — Z66 Do not resuscitate: Secondary | ICD-10-CM | POA: Diagnosis present

## 2016-02-26 DIAGNOSIS — N189 Chronic kidney disease, unspecified: Secondary | ICD-10-CM

## 2016-02-26 LAB — I-STAT CG4 LACTIC ACID, ED: Lactic Acid, Venous: 1.88 mmol/L (ref 0.5–2.0)

## 2016-02-26 MED ORDER — PANTOPRAZOLE SODIUM 40 MG IV SOLR
40.0000 mg | Freq: Two times a day (BID) | INTRAVENOUS | Status: DC
  Administered 2016-02-27 – 2016-02-29 (×6): 40 mg via INTRAVENOUS
  Filled 2016-02-26 (×6): qty 40

## 2016-02-26 MED ORDER — ONDANSETRON HCL 4 MG PO TABS: 4.0000 mg | ORAL_TABLET | Freq: Four times a day (QID) | ORAL | Status: DC | PRN

## 2016-02-26 MED ORDER — SODIUM CHLORIDE 0.9 % IV SOLN
INTRAVENOUS | Status: DC
  Administered 2016-02-26 – 2016-02-28 (×3): 1000 mL via INTRAVENOUS

## 2016-02-26 MED ORDER — ONDANSETRON HCL 4 MG/2ML IJ SOLN: 4.0000 mg | Freq: Four times a day (QID) | INTRAMUSCULAR | Status: DC | PRN

## 2016-02-26 MED ORDER — LORAZEPAM 2 MG/ML IJ SOLN
0.5000 mg | Freq: Once | INTRAMUSCULAR | Status: AC
  Administered 2016-02-26: 0.5 mg via INTRAVENOUS
  Filled 2016-02-26: qty 1

## 2016-02-26 MED ORDER — LORAZEPAM 2 MG/ML IJ SOLN: 0.5000 mg | INTRAMUSCULAR | Status: DC | PRN

## 2016-02-26 MED ORDER — SODIUM CHLORIDE 0.9 % IV BOLUS (SEPSIS)
1000.0000 mL | Freq: Once | INTRAVENOUS | Status: AC
  Administered 2016-02-26: 1000 mL via INTRAVENOUS

## 2016-02-26 MED ORDER — PANTOPRAZOLE SODIUM 40 MG IV SOLR
40.0000 mg | Freq: Once | INTRAVENOUS | Status: AC
  Administered 2016-02-26: 40 mg via INTRAVENOUS
  Filled 2016-02-26: qty 40

## 2016-02-26 NOTE — ED Notes (Signed)
[facility] Expected date:  Expected time:  Means of arrival:  Comments: EMS-hospice patient/seen earlier

## 2016-02-26 NOTE — ED Notes (Signed)
MD at bedside.  [employer]  Hospitalist

## 2016-02-26 NOTE — ED Notes (Signed)
Called floor to give report. Nurse on floor to call back.

## 2016-02-26 NOTE — ED Notes (Signed)
1000 in IV- IO

## 2016-02-26 NOTE — Progress Notes (Signed)
Nursing Note: Paged admitting Md that pt arrived per orders.Pt resting quietly in bed.wbb

## 2016-02-26 NOTE — H&P (Signed)
History and Physical    Chantay Whitelock ZOX:[id number] DOB: July 06, 1917 DOA: 02/26/2016  PCP: No primary care provider on file.  Patient coming from: [facility].   Chief Complaint: "Possible dehydration"   HPI: Mary Mcclain is a 82 y.o. female with medical history significant of CKD, chronic atrial fibrillation, anemia, GERD, DNR/DNI who is a resident at [facility], was seen at [facility] yesterday due to coffee ground emesis and is now brought to [facility] ED due to dehydration and hypotension.  The patient is unable to provide history at this time, but on a recent palliative care consult she did not want to be hospitalized in the future. However, at this point, she is unable to make decisions and family members would like her to get IVF before they return with her to the [facility] facility.  ED Course: Initially, the patient was hypotensive and tachycardic, but this improved with one liter of NS bolus. We are admitting her for comfort care.   Review of Systems:  Unable to review due to patient's sedation.  Past Medical History  Diagnosis Date  . Chronic kidney disease (CKD)   . Atrial fibrillation (HCC)   . Anemia   . GERD (gastroesophageal reflux disease)     History reviewed. No pertinent past surgical history.   reports that she has quit smoking. She has never used smokeless tobacco. She reports that she does not drink alcohol or use illicit drugs.  Allergies  Allergen Reactions  . Sulfa Antibiotics Other (See Comments)    Pt stated it has been so long ago that she didn't remember what happens    History reviewed. No pertinent family history.   Prior to Admission medications   Medication Sig Start Date End Date Taking? Authorizing Provider  acetaminophen (TYLENOL) 325 MG tablet Take 650 mg by mouth every 4 (four) hours as needed for mild pain.   Yes Historical Provider, MD  bisacodyl (DULCOLAX) 10 MG suppository Place 10 mg rectally as needed for mild constipation  or moderate constipation.   Yes Historical Provider, MD  ENSURE (ENSURE) Take 237 mLs by mouth 2 (two) times daily.   Yes Historical Provider, MD  hydrocortisone cream 1 % Apply 1 application topically 2 (two) times daily. Apply to back   Yes Historical Provider, MD  LORazepam (ATIVAN) 2 MG/ML concentrated solution Take 0.5 mg by mouth every 12 (twelve) hours as needed for anxiety.   Yes Historical Provider, MD  Menthol-Zinc Oxide 0.44-20.625 % OINT Apply 1 application topically 2 (two) times daily. To buttocks   Yes Historical Provider, MD  mirtazapine (REMERON) 30 MG tablet Take 30 mg by mouth at bedtime.   Yes Historical Provider, MD  morphine (ROXANOL) 20 MG/ML concentrated solution Take 0.25 mg by mouth every 6 (six) hours as needed for severe pain.   Yes Historical Provider, MD  Multiple Vitamins-Minerals (MULTI ADULT GUMMIES PO) Take by mouth daily. (pediatric multivit-minerals-vitamin C)   Yes Historical Provider, MD  Nutritional Supplements (NUTRITIONAL DRINK PO) Take 1 each by mouth daily. Mighty Shake   Yes Historical Provider, MD  OXYGEN Inhale 2 L into the lungs daily. Continuously   Yes Historical Provider, MD  warfarin (COUMADIN) 1 MG tablet Take 0.5-1 mg by mouth at bedtime. Take 0.5mg  every Sun / Tue / Magdalene Molly / Sat Take  every Mon / Wed / Fri   Yes Historical Provider, MD    Physical Exam: Filed Vitals:   02/26/16 1749 02/26/16 1820 02/26/16 1830 02/26/16 1840  BP: 79/54 106/59 124/62 121/62  Pulse: 105     Resp: 14 15 10 12       Constitutional: NAD, sedated.  Filed Vitals:   02/26/16 1749 02/26/16 1820 02/26/16 1830 02/26/16 1840  BP: 79/54 106/59 124/62 121/62  Pulse: 105     Resp: 14 15 10 12    Eyes: PERRL, lids and conjunctivae normal ENMT: Mucous membranes are dry. Posterior pharynx clear of any exudate or lesions. Neck: normal, supple, no masses, no thyromegaly Respiratory: clear to auscultation bilaterally, no wheezing, no crackles. Normal respiratory  effort. No accessory muscle use.  Cardiovascular: Irregularly irregular, no murmurs / rubs / gallops. No extremity edema. 2+ pedal pulses. No carotid bruits.  Abdomen: Bowel sounds positive, no tenderness, no masses palpated. No hepatosplenomegaly.  Musculoskeletal: no clubbing / cyanosis. No joint deformity upper and lower extremities. Good ROM, no contractures. Relaxed muscle tone.  Skin: Positive ecchymoses on extremities. Neurologic: Sedated. Relaxed muscle tone.  Psychiatric: Sedated.     Labs on Admission: I have personally reviewed following labs and imaging studies.  CBC:  Recent Labs Lab 02/25/16 1213  WBC 10.1  HGB 15.1*  HCT 48.8*  MCV 82.7  PLT 165   Basic Metabolic Panel:  Recent Labs Lab 02/25/16 1213  NA 141  K 3.1*  CL 93*  CO2 15*  GLUCOSE 242*  BUN 20  CREATININE 2.65*  CALCIUM 10.9*   GFR: CrCl cannot be calculated (Unknown ideal weight.). Liver Function Tests:  Recent Labs Lab 02/25/16 1213  AST 31  ALT 14  ALKPHOS 67  BILITOT 1.8*  PROT 7.2  ALBUMIN 2.8*   Coagulation Profile:  Recent Labs Lab 02/25/16 1321  INR 1.06   Urine analysis:    Component Value Date/Time   COLORURINE AMBER* 12/29/2015 1226   APPEARANCEUR HAZY* 12/29/2015 1226   LABSPEC 1.016 12/29/2015 1226   PHURINE 5.5 12/29/2015 1226   GLUCOSEU NEGATIVE 12/29/2015 1226   HGBUR TRACE* 12/29/2015 1226   BILIRUBINUR LARGE* 12/29/2015 1226   KETONESUR 15* 12/29/2015 1226   PROTEINUR 30* 12/29/2015 1226   NITRITE NEGATIVE 12/29/2015 1226   LEUKOCYTESUR SMALL* 12/29/2015 1226    EKG: Independently reviewed. Vent. rate 106 BPM PR interval * ms QRS duration 102 ms QT/QTc 368/489 ms P-R-T axes -1 -38 189 Atrial fibrillation Inferior infarct, old Anterior infarct, old Lateral leads are also involved  Assessment/Plan Principal Problem:   Volume depletion Continue supportive care. Continue supplemental oxygen. Continue IV hydration.  Active Problems:    Chronic atrial fibrillation (HCC)   CHA2DS2-VASc of at least 4 Stable.  HR has improved with IV hydration. Hold oral medications. Continue IV hydration.    Gastroesophageal reflux disease without esophagitis Continue IV pantoprazole.     Dying care Supportive care. Continue lorazepam as needed. No blood draws. Palliative care evaluation in am.    DVT prophylaxis: SCDs. Code Status: DNR/DNI Family Communication:  Disposition Plan: Admit for comfort care. Consults called:  Admission status: Observation/MedSurg.   Bobette Moavid Manuel Angelina Neece MD [facility] Pager [phone]  If 7PM-7AM, please contact night-coverage [email or web address] Password [email or web address]  02/26/2016, 7:10 PM

## 2016-02-26 NOTE — ED Notes (Signed)
Per EMS, [facility], pt's family member wanted pt transported to the ED for possible dehydration.  Per EMS, pt did not want to be transported to the ED but her son insisted.  Upon arrival to the ED, son has asked the EDP at the desk for something, then asked for water for pt.  Made him aware that he needs to wait until pt is seen by the doctor first.  While this nurse is getting report from the EMT, son preceded to walk towards the registration area to ask for water, asked him again to wait until the pt is assessed by the EDP.  He states that he is a PA and pt just needs some water.  Explained to him that it is the ED's policy that pt's cannot have anything to eat or drink prior to seeing an EDP.

## 2016-02-26 NOTE — ED Notes (Signed)
Attempted to call report to Par[facility].  Per Janeal HolmesAllison, Bernita is in a pt's room & will call back.

## 2016-02-26 NOTE — Progress Notes (Signed)
Nursing Note: Pt arrived via stretcher.Pt asleep.Responds when moved onto bed.Pt moans and fuses when moved onto bed.Pt fuses when moved and combative otherwise pt sleeps.HOb elevated and heels up on pillows.wbb

## 2016-02-26 NOTE — ED Provider Notes (Signed)
CSN: [id number]     Arrival date & time 02/26/16  1722 History   First MD Initiated Contact with Patient 02/26/16 1742     Chief Complaint  Patient presents with  . Dehydration     (Consider location/radiation/quality/duration/timing/severity/associated sxs/prior Treatment) HPI   82 year old female brought in for evaluation by the request of her son.   I actually evaluated her yesterday in the emergency room. She had evidence of an upper GI bleed with dark brown/black material in her oropharynx and on her lips. She was very hypotensive. She had a significantly elevated lactic acid and acute kidney injury. DO NOT RESUSCITATE status was noted on arrival. Therapies were started including IVF, PPI and labs. Once had a chance to look through some of her records though, I saw a note from palliative care from March.  "During our meeting this morning, Mary Mcclain completed a MOST form with her grandson, Mary Mcclain, and endorsed minimal medical intervention and no returns to the hospital. Patient is not expected to live more than one calendar year. Shs has had recurrent urinary tract infections, battled with severe depression lately and has had several falls in the past few months. She is currently DNR."  She was very sick but was talking talking with me some. She restated her wishes not to be hospitalized. She finished the IVF that was already hung but further care was discontinued and she was discharged back to her facility.  I discussed this all with her son but he would still like her to be admitted. It doesn't not sound like there has been much interval change since she was last seen. She continues to remains very tired/weak and is again hypotensive.   Past Medical History  Diagnosis Date  . Chronic kidney disease (CKD)   . Atrial fibrillation (HCC)   . Anemia   . GERD (gastroesophageal reflux disease)    History reviewed. No pertinent past surgical history. No family history on  file. Social History  Substance Use Topics  . Smoking status: Former Games developer  . Smokeless tobacco: Never Used  . Alcohol Use: No   OB History    No data available     Review of Systems  Level 5 caveat because of medical acuity.  Allergies  Sulfa antibiotics  Home Medications   Prior to Admission medications   Medication Sig Start Date End Date Taking? Authorizing Provider  acetaminophen (TYLENOL) 325 MG tablet Take 650 mg by mouth every 4 (four) hours as needed for mild pain.    Historical Provider, MD  bisacodyl (DULCOLAX) 10 MG suppository Place 10 mg rectally as needed for mild constipation or moderate constipation.    Historical Provider, MD  ENSURE (ENSURE) Take 237 mLs by mouth 2 (two) times daily.    Historical Provider, MD  hydrocortisone cream 1 % Apply 1 application topically 2 (two) times daily. Apply to back    Historical Provider, MD  LORazepam (ATIVAN) 2 MG/ML concentrated solution Take 0.5 mg by mouth every 12 (twelve) hours as needed for anxiety.    Historical Provider, MD  Menthol-Zinc Oxide 0.44-20.625 % OINT Apply 1 application topically 2 (two) times daily. To buttocks    Historical Provider, MD  mirtazapine (REMERON) 30 MG tablet Take 30 mg by mouth at bedtime.    Historical Provider, MD  morphine (ROXANOL) 20 MG/ML concentrated solution Take 0.25 mg by mouth every 6 (six) hours as needed for severe pain.    Historical Provider, MD  OXYGEN Inhale 2 L  into the lungs daily. Continuously    Historical Provider, MD  warfarin (COUMADIN) 1 MG tablet Take 0.5-1 mg by mouth at bedtime. Take 0.5mg  every Sun / Tue / Thurs / Sat Take  every Mon / Wed / Fri    Historical Provider, MD   BP 79/54 mmHg  Pulse 105  Resp 14 Physical Exam  Constitutional: She appears well-developed.  Frail and chronically ill-appearing.  HENT:  Head: Normocephalic and atraumatic.  Eyes: Conjunctivae are normal. Right eye exhibits no discharge. Left eye exhibits no discharge.  Neck:  Neck supple.  Cardiovascular: Regular rhythm and normal heart sounds.  Exam reveals no gallop and no friction rub.   No murmur heard. Tachycardic. Irregularly irregular  Pulmonary/Chest: Effort normal and breath sounds normal. No respiratory distress.  Abdominal: Soft. She exhibits no distension. There is no tenderness.  Musculoskeletal: She exhibits no edema or tenderness.  Neurological:  Drowsy. Opens eyes to voice. Does respond to questions but I cannot comprehend her responses.  Skin: Skin is warm and dry.  Nursing note and vitals reviewed.   ED Course  Procedures (including critical care time) Labs Review Labs Reviewed  BASIC METABOLIC PANEL - Abnormal; Notable for the following:    Sodium 147 (*)    Potassium 2.8 (*)    Chloride 117 (*)    BUN 40 (*)    Creatinine, Ser 1.99 (*)    GFR calc non Af Amer 20 (*)    GFR calc Af Amer 23 (*)    All other components within normal limits  MAGNESIUM - Abnormal; Notable for the following:    Magnesium 1.5 (*)    All other components within normal limits  BASIC METABOLIC PANEL - Abnormal; Notable for the following:    Potassium 3.3 (*)    Chloride 115 (*)    Glucose, Bld 116 (*)    BUN 33 (*)    Creatinine, Ser 1.64 (*)    GFR calc non Af Amer 25 (*)    GFR calc Af Amer 29 (*)    All other components within normal limits  CBC - Abnormal; Notable for the following:    Hemoglobin 10.3 (*)    HCT 31.8 (*)    RDW 27.3 (*)    Platelets 104 (*)    All other components within normal limits  MAGNESIUM - Abnormal; Notable for the following:    Magnesium 1.3 (*)    All other components within normal limits  I-STAT CG4 LACTIC ACID, ED    Imaging Review No results found. I have personally reviewed and evaluated these images and lab results as part of my medical decision-making.   EKG Interpretation   Date/Time:  Monday Feb 26 2016 17:49:31 EDT Ventricular Rate:  106 PR Interval:    QRS Duration: 102 QT Interval:  368 QTC  Calculation: 489 R Axis:   -38 Text Interpretation:  Atrial fibrillation Inferior infarct, old Anterior  infarct, old Lateral leads are also involved No significant change since  last tracing Confirmed by ALLEN  MD, ANTHONY ([id number]) on 02/28/2016 12:11:54  PM      MDM   Final diagnoses:  Dying care    89yF who I actually saw in the emergency room yesterday at [facility]. My impression was that she was dying. On reviewing records it was noted that her wishes were not to be hospitalized during palliative care consult two months ago. She is very sick but was responding to questions and told me she did  not want to be hospitalized. She was discharged back to her facility. Her son is with her today and is requesting that she receive fluids and have repeat labs.   He is a former Publishing rights managernurse practitioner in the ER. I think he understands his mother is dying, but is not quite ready to accept it. He requested her labs which I provided to him. He is requesting repeat blood work and to give her potassium and insulin. I tried explaining that their is not much utility in repeating labs if it will not change management and that therapies such as correcting her glucose or potassium will not make her significantly more comfortable. Sticks for blood draws are painful. I doubt she could swallow oral potassium and IV potassium infusion can be painful as well. He is pushing to have her hospitalized. Discussed with him that when I was speaking with his mother yesterday she reiterated that she did not want to be in the hospital. I gently tried to tell him that even though she is very sick that she seemed to understand what I was saying and asking her. He offered resistance to her being discharged again and I did not press it further. He is fine with IV fluids and comfort measures but did request a h2 blocker or PPI. She likely does have an UGIB and does seem to have some tenderness on exam. I do not think this is completely  unreasonable.  Will admit for palliative purposes.     Raeford RazorStephen Monserrate Blaschke, MD 03/06/16 2238

## 2016-02-26 NOTE — Progress Notes (Signed)
CSW attempted to meet with patient at bedside. However, she was asleep. Per note, [facility].   CSW will attempt to speak with patient again later.  Trish MageBrittney Meenakshi Sazama, LCSWA [phone] ED CSW 02/26/2016 7:59 PM

## 2016-02-27 DIAGNOSIS — K219 Gastro-esophageal reflux disease without esophagitis: Secondary | ICD-10-CM | POA: Diagnosis present

## 2016-02-27 DIAGNOSIS — R000 Tachycardia, unspecified: Secondary | ICD-10-CM | POA: Diagnosis present

## 2016-02-27 DIAGNOSIS — I959 Hypotension, unspecified: Secondary | ICD-10-CM | POA: Diagnosis present

## 2016-02-27 DIAGNOSIS — E860 Dehydration: Secondary | ICD-10-CM | POA: Diagnosis present

## 2016-02-27 DIAGNOSIS — Z882 Allergy status to sulfonamides status: Secondary | ICD-10-CM | POA: Diagnosis not present

## 2016-02-27 DIAGNOSIS — Z7901 Long term (current) use of anticoagulants: Secondary | ICD-10-CM | POA: Diagnosis not present

## 2016-02-27 DIAGNOSIS — Z66 Do not resuscitate: Secondary | ICD-10-CM | POA: Diagnosis present

## 2016-02-27 DIAGNOSIS — R1110 Vomiting, unspecified: Secondary | ICD-10-CM | POA: Diagnosis present

## 2016-02-27 DIAGNOSIS — E869 Volume depletion, unspecified: Secondary | ICD-10-CM | POA: Diagnosis present

## 2016-02-27 DIAGNOSIS — E872 Acidosis: Secondary | ICD-10-CM | POA: Diagnosis present

## 2016-02-27 DIAGNOSIS — Z79899 Other long term (current) drug therapy: Secondary | ICD-10-CM | POA: Diagnosis not present

## 2016-02-27 DIAGNOSIS — I129 Hypertensive chronic kidney disease with stage 1 through stage 4 chronic kidney disease, or unspecified chronic kidney disease: Secondary | ICD-10-CM | POA: Diagnosis present

## 2016-02-27 DIAGNOSIS — Z515 Encounter for palliative care: Secondary | ICD-10-CM

## 2016-02-27 DIAGNOSIS — R627 Adult failure to thrive: Secondary | ICD-10-CM | POA: Diagnosis present

## 2016-02-27 DIAGNOSIS — N179 Acute kidney failure, unspecified: Secondary | ICD-10-CM | POA: Diagnosis present

## 2016-02-27 DIAGNOSIS — R7989 Other specified abnormal findings of blood chemistry: Secondary | ICD-10-CM | POA: Diagnosis present

## 2016-02-27 DIAGNOSIS — I482 Chronic atrial fibrillation: Secondary | ICD-10-CM | POA: Diagnosis present

## 2016-02-27 DIAGNOSIS — Z9981 Dependence on supplemental oxygen: Secondary | ICD-10-CM | POA: Diagnosis not present

## 2016-02-27 DIAGNOSIS — N183 Chronic kidney disease, stage 3 (moderate): Secondary | ICD-10-CM | POA: Diagnosis present

## 2016-02-27 DIAGNOSIS — E876 Hypokalemia: Secondary | ICD-10-CM | POA: Diagnosis present

## 2016-02-27 DIAGNOSIS — K920 Hematemesis: Secondary | ICD-10-CM | POA: Diagnosis not present

## 2016-02-27 MED ORDER — MORPHINE SULFATE (PF) 2 MG/ML IV SOLN
1.0000 mg | INTRAVENOUS | Status: DC | PRN
Start: 2016-02-27 — End: 2016-02-29
  Administered 2016-02-27: 1 mg via INTRAVENOUS
  Filled 2016-02-27 (×4): qty 1

## 2016-02-27 MED ORDER — CETYLPYRIDINIUM CHLORIDE 0.05 % MT LIQD
7.0000 mL | Freq: Two times a day (BID) | OROMUCOSAL | Status: DC
  Administered 2016-02-27 – 2016-02-28 (×3): 7 mL via OROMUCOSAL

## 2016-02-27 MED ORDER — CHLORHEXIDINE GLUCONATE 0.12 % MT SOLN
15.0000 mL | Freq: Two times a day (BID) | OROMUCOSAL | Status: DC
  Administered 2016-02-27 – 2016-02-28 (×5): 15 mL via OROMUCOSAL
  Filled 2016-02-27 (×5): qty 15

## 2016-02-27 NOTE — Progress Notes (Signed)
No S/S of pain. Pt resting quietly

## 2016-02-27 NOTE — Progress Notes (Signed)
UPDATE: Discussed case with pt's son, POA, Stann MainlandClancy who states at time of admission, patient had requested hospital admission at that time. Pt's son states he would like to take patient home with hospice. Would update CM.

## 2016-02-27 NOTE — Progress Notes (Signed)
PROGRESS NOTE    Mary Mcclain  ZOX:[id number] DOB: 1917/07/07 DOA: 02/26/2016 PCP: No primary care provider on file. [facility]   Brief Narrative:  82 y.o. female with medical history significant of CKD, chronic atrial fibrillation, anemia, GERD, DNR/DNI who is a resident at [facility], was seen at [facility] yesterday due to coffee ground emesis and is now brought to [facility] ED due to dehydration and hypotension.   Assessment & Plan:   Principal Problem:   Volume depletion Active Problems:   Chronic atrial fibrillation (HCC)   Gastroesophageal reflux disease without esophagitis   Dying care   Acute Renal Failure secondary toVolume depletion Presenting Cr 2.65, was 0.97 in 3/17 Continue supportive care. Continue supplemental oxygen. Continue IV hydration as tolerated  Lactic Acidosis - Suspect secondary to dehydration - Lactate normalized :  Chronic atrial fibrillation (HCC)  CHA2DS2-VASc of at least 4 Remains stable.  HR has improved with IV hydration. Holding oral medications. Continue IV hydration per above   Gastroesophageal reflux disease without esophagitis Continue IV pantoprazole.    Dying care Supportive care. Continue lorazepam as needed. No blood draws.  DVT prophylaxis: Comfort measures Code Status: DNR Family Communication: None in room Disposition Plan: Possible return to facility if stable within 24hrs   Consultants:     Procedures:     Antimicrobials:       Subjective: Unable to assess given altered mental status  Objective: Filed Vitals:   02/26/16 2102 02/26/16 2222 02/27/16 0536 02/27/16 1301  BP: 91/49 111/55 109/59 103/62  Pulse: 88 115 108 82  Temp:  97.3 F (36.3 C) 97.2 F (36.2 C) 99.2 F (37.3 C)  TempSrc:  Axillary Axillary Oral  Resp: Weight:      SpO2:  100% 100% 100%    Intake/Output Summary (Last 24 hours) at 02/27/16 1527 Last data filed at 02/27/16 0634  Gross per 24 hour    Intake   1825 ml  Output      0 ml  Net   1825 ml   Filed Weights   02/26/16 2045  Weight: 66.679 kg (147 lb)    Examination:  General exam: Appears calm and comfortable, appears lethargic Respiratory system: Clear to auscultation. Respiratory effort normal. Cardiovascular system: S1 & S2 heard, RRR.  Gastrointestinal system: Abdomen is nondistended, soft and nontender. No organomegaly or masses felt. Normal bowel sounds heard. Central nervous system: Alert and oriented. No focal neurological deficits. Extremities: Symmetric 5 x 5 power. Skin: No rashes, lesions Psychiatry: Appears lethargic. Difficult to assess    Data Reviewed: I have personally reviewed following labs and imaging studies  CBC:  Recent Labs Lab 02/25/16 1213  WBC 10.1  HGB 15.1*  HCT 48.8*  MCV 82.7  PLT 165   Basic Metabolic Panel:  Recent Labs Lab 02/25/16 1213  NA 141  K 3.1*  CL 93*  CO2 15*  GLUCOSE 242*  BUN 20  CREATININE 2.65*  CALCIUM 10.9*   GFR: Estimated Creatinine Clearance: 11.8 mL/min (by C-G formula based on Cr of 2.65). Liver Function Tests:  Recent Labs Lab 02/25/16 1213  AST 31  ALT 14  ALKPHOS 67  BILITOT 1.8*  PROT 7.2  ALBUMIN 2.8*   No results for input(s): LIPASE, AMYLASE in the last 168 hours. No results for input(s): AMMONIA in the last 168 hours. Coagulation Profile:  Recent Labs Lab 02/25/16 1321  INR 1.06   Cardiac Enzymes: No results for input(s): CKTOTAL, CKMB, CKMBINDEX,  TROPONINI in the last 168 hours. BNP (last 3 results) No results for input(s): PROBNP in the last 8760 hours. HbA1C: No results for input(s): HGBA1C in the last 72 hours. CBG: No results for input(s): GLUCAP in the last 168 hours. Lipid Profile: No results for input(s): CHOL, HDL, LDLCALC, TRIG, CHOLHDL, LDLDIRECT in the last 72 hours. Thyroid Function Tests: No results for input(s): TSH, T4TOTAL, FREET4, T3FREE, THYROIDAB in the last 72 hours. Anemia Panel: No  results for input(s): VITAMINB12, FOLATE, FERRITIN, TIBC, IRON, RETICCTPCT in the last 72 hours. Sepsis Labs:  Recent Labs Lab 02/25/16 1321 02/25/16 1524 02/26/16 1820  LATICACIDVEN 6.28* 5.79* 1.88    No results found for this or any previous visit (from the past 240 hour(s)).       Radiology Studies: No results found.      Scheduled Meds: . antiseptic oral rinse  7 mL Mouth Rinse q12n4p  . chlorhexidine  15 mL Mouth Rinse BID  . pantoprazole (PROTONIX) IV  40 mg Intravenous Q12H   Continuous Infusions: . sodium chloride 1,000 mL (02/26/16 2050)     LOS: 1 day    Miyeko Mahlum, Scheryl MartenSTEPHEN K, MD [facility] Pager [phone]  If 7PM-7AM, please contact night-coverage [email or web address] Password [email or web address] 02/27/2016, 3:27 PM

## 2016-02-27 NOTE — Care Management Note (Signed)
Case Management Note  Patient Details  Name: Mary Mcclain MRN: [id number] Date of Birth: 12/23/1916  Subjective/Objective:             82 yo admitted with Volume depletion.         Action/Plan: [facility].  This CM confirmed with [facility] rep that pt is currently active with them.  [facility].  CM will continue to follow and assist as needed.  Expected Discharge Date:   (unknown)               Expected Discharge Plan:  Assisted Living / Rest Home  In-House Referral:  Clinical Social Work  Discharge planning Services  CM Consult  Post Acute Care Choice:    Choice offered to:     DME Arranged:    DME Agency:     HH Arranged:    HH Agency:     Status of Service:  In process, will continue to follow  Medicare Important Message Given:    Date Medicare IM Given:    Medicare IM give by:    Date Additional Medicare IM Given:    Additional Medicare Important Message give by:     If discussed at Long Length of Stay Meetings, dates discussed:    Additional CommentsBartholome Bill:  Jaydy Fitzhenry H, RN 02/27/2016, 2:47 PM  [phone]

## 2016-02-28 DIAGNOSIS — I482 Chronic atrial fibrillation: Secondary | ICD-10-CM

## 2016-02-28 DIAGNOSIS — N179 Acute kidney failure, unspecified: Secondary | ICD-10-CM | POA: Diagnosis present

## 2016-02-28 DIAGNOSIS — E876 Hypokalemia: Secondary | ICD-10-CM

## 2016-02-28 DIAGNOSIS — R627 Adult failure to thrive: Secondary | ICD-10-CM

## 2016-02-28 DIAGNOSIS — K219 Gastro-esophageal reflux disease without esophagitis: Secondary | ICD-10-CM

## 2016-02-28 DIAGNOSIS — N189 Chronic kidney disease, unspecified: Secondary | ICD-10-CM

## 2016-02-28 LAB — BASIC METABOLIC PANEL
ANION GAP: 7 (ref 5–15)
BUN: 40 mg/dL — AB (ref 6–20)
CHLORIDE: 117 mmol/L — AB (ref 101–111)
CO2: 23 mmol/L (ref 22–32)
Calcium: 9.2 mg/dL (ref 8.9–10.3)
Creatinine, Ser: 1.99 mg/dL — ABNORMAL HIGH (ref 0.44–1.00)
GFR calc Af Amer: 23 mL/min — ABNORMAL LOW (ref >60)
GFR, EST NON AFRICAN AMERICAN: 20 mL/min — AB (ref >60)
GLUCOSE: 92 mg/dL (ref 65–99)
POTASSIUM: 2.8 mmol/L — AB (ref 3.5–5.1)
SODIUM: 147 mmol/L — AB (ref 135–145)

## 2016-02-28 LAB — MAGNESIUM: MAGNESIUM: 1.5 mg/dL — AB (ref 1.7–2.4)

## 2016-02-28 MED ORDER — POTASSIUM CHLORIDE 20 MEQ/15ML (10%) PO SOLN: 40.0000 meq | ORAL | Status: DC

## 2016-02-28 MED ORDER — POTASSIUM CHLORIDE 10 MEQ/100ML IV SOLN
10.0000 meq | INTRAVENOUS | Status: AC
  Administered 2016-02-28 (×4): 10 meq via INTRAVENOUS
  Filled 2016-02-28 (×5): qty 100

## 2016-02-28 MED ORDER — SODIUM CHLORIDE 0.9 % IV BOLUS (SEPSIS): 500.0000 mL | Freq: Once | INTRAVENOUS | Status: DC

## 2016-02-28 MED ORDER — SODIUM CHLORIDE 0.9 % IV BOLUS (SEPSIS)
250.0000 mL | Freq: Once | INTRAVENOUS | Status: AC
  Administered 2016-02-28: 250 mL via INTRAVENOUS

## 2016-02-28 MED ORDER — POTASSIUM CHLORIDE 2 MEQ/ML IV SOLN
INTRAVENOUS | Status: DC
  Administered 2016-02-28: 09:00:00 via INTRAVENOUS
  Filled 2016-02-28 (×4): qty 1000

## 2016-02-28 NOTE — Clinical Documentation Improvement (Signed)
Internal Medicine  Can the diagnosis of CKD be further specified? Please document response in next progress note. Thank you!   CKD Stage I - GFR greater than or equal to 90  CKD Stage II - GFR 60-89  CKD Stage III - GFR 30-59  CKD Stage IV - GFR 15-29  CKD Stage V - GFR < 15  ESRD (End Stage Renal Disease)  Other condition  Unable to clinically determine  Supporting Information: : (risk factors, signs and symptoms, diagnostics, treatment)  GFR's running 14 to 20 for current admission; previous admission in March: 28 to 47  Please exercise your independent, professional judgment when responding. A specific answer is not anticipated or expected.  Thank You, Shellee MiloEileen T Zian Delair RN, BSN, CCDS Health Information Management [facility] [phone]; Cell: [phone]

## 2016-02-28 NOTE — Progress Notes (Signed)
Per Dr. Rhona Leavenshiu, son Stann MainlandClancy wants to take pt home with hospice services.  This CM contacted son Phineas SemenClancy Jenny about DC planning.  Son does confirm that he wants to take pt home with him with hospice services.  This CM contacted Bambi, [facility] hospice rep to inform her of new DC plan. Bambi to contact son [facility].  Son did confirm that ambulance transport would be needed to get pt home.  Medical transport form filled out and printed, along with demographic sheet.  These were given to RN along with [facility] number to set up transport when equipment is settled.  No other CM needs identified.   Sandford Crazeora Mekayla Soman RN,BSN,NCM [phone]

## 2016-02-28 NOTE — Progress Notes (Signed)
Nutrition Brief Note  Patient identified as having a low Braden Score.  Chart reviewed. Pt now transitioning to comfort care.  No nutrition interventions warranted at this time.  Please consult as needed.   August Gosser, MS, RD, LDN Pager: [phone] After Hours Pager: [phone]    

## 2016-02-28 NOTE — Clinical Social Work Note (Signed)
Clinical Social Work Assessment  Patient Details  Name: Mary Mcclain MRN: [id number] Date of Birth: 03/12/1917  Date of referral:  02/28/16               Reason for consult:  Discharge Planning, Other (Comment Required) (From ALF)                Permission sought to share information with:  Case Manager, Facility Medical sales representativeContact Representative, Family Supports Permission granted to share information::  Yes, Verbal Permission Granted  Name::        Agency::  [facility],  Prisilla (RN) [phone]  Relationship::  Son  SolicitorContact Information:     Housing/Transportation Living arrangements for the past 2 months:  Assisted DealerLiving Facility Source of Information:  Medical Team, Facility, Sports coachCase Manager, Adult Children Patient Interpreter Needed:  None Criminal Activity/Legal Involvement Pertinent to Current Situation/Hospitalization:  No - Comment as needed Significant Relationships:  Adult Children, Merchandiser, retailCommunity Support Lives with:  Facility Resident Do you feel safe going back to the place where you live?  Yes Need for family participation in patient care:  Yes (Comment)  Care giving concerns:  No care giving concerns at this time. [facility] LCSW received call from facility today requesting an update and time for discharge.  No barriers with returning to ALF per RN.  RN reports patient at times refuses treatment as [facility] and then DC back and son came to facility and brought patient back to hospital where she was admitted for dehydration.    Son reports not concerns, has been active with care at hospital and plans will change at DC with son taking patient home with hospice care in his home. Reports they are going by ALF today to pick up her hospital bed and other belongings. He reports [facility] about plan and LCSW will follow up with plan to ensure.  Social Worker assessment / plan:  LCSW completed assessment for  unit SW due to call coming into this Clinical research associatewriter. Patient spoke with facility. Son contacted via phone:  Reports going home with hospice instead of returning to ALF. CSW unit was made aware of plan and updated. LCSW will assist as needed for any other CSW needs.  Employment status:  Retired Health and safety inspectornsurance information:  Medicare PT Recommendations:  Not assessed at this time Information / Referral to community resources:  Other (Comment Required) (none at this time. Patient active with Hospice at facility)  Patient/Family's Response to care:  Agreeable to plan  Patient/Family's Understanding of and Emotional Response to Diagnosis, Current Treatment, and Prognosis:  Son aware of current prognosis and treatment for patient and at this time he wants to be taking care of her at home with hospice.  Appreciative of services and assistance in making this happen within his home.  Emotional Assessment Appearance:  Appears stated age Attitude/Demeanor/Rapport:  Other (restless and refusing treatment at times, overall confused but pleasant) Affect (typically observed):  Accepting, Adaptable Orientation:  Oriented to Self Alcohol / Substance use:  Not Applicable Psych involvement (Current and /or in the community):  No (Comment)  Discharge Needs  Concerns to be addressed:  No discharge needs identified Readmission within the last 30 days:  No Current discharge risk:  None Barriers to Discharge:  No Barriers Identified   Raye SorrowCoble, Maritza Hosterman N, LCSW 02/28/2016, 11:14 AM

## 2016-02-28 NOTE — Progress Notes (Signed)
PROGRESS NOTE    Mary SchimkeLouise Minich  JYN:[id number] DOB: 03/03/1917 DOA: 02/26/2016 PCP: No primary care provider on file.    Assessment & Plan:   Principal Problem:   Volume depletion Active Problems:   FTT (failure to thrive) in adult   Chronic atrial fibrillation (HCC)   Gastroesophageal reflux disease without esophagitis   Dying care   Hypokalemia   Hypomagnesemia   Renal failure (ARF), acute on chronic (HCC) stage 3  #1 failure to thrive/dehydration/hypotension Patient presented with significant dehydration volume depletion and noted to be hypotensive on admission. Blood pressure responded to IV fluids. Prior to admission patient was noted to have some coffee-ground emesis and was seen in the ED 1 day prior to admission. It was noted in prior palliative care notes from March 2017 that patient did not want to be hospitalized in the future with minimal interventions. Will continue IV fluids. Supportive care. Patient likely dying. Spoke to son and will transition to full comfort care on discharge with hospice to follow patient at home.  #2 hypokalemia/hypomagnesemia Replete.  #3 chronic atrial fibrillation Chads2vasc score 4 Currently stable. Patient still tachycardic despite improvement with IV fluids. Oral medications on hold.Coumadin on hold secondary to recent coffee ground emesis. Will likely not resume Coumadin on discharge as patient be discharged home with hospice. Follow.  #4 gastroesophageal reflux disease PPI.  #5 acute on chronic kidney disease stage III Likely secondary to prerenal azotemia. Renal function trending down. Continue IV fluids. Follow.    #6 recent coffee-ground emesis No further episodes. H&H stable. Coumadin on hold.      DVT prophylaxis: SCDs Cpelvis another 5ode Status: DO NOT RESUSCITATE Family Communication: Updated son Stann MainlandClancy via telephone([phone]) Disposition Plan: Home with hospice tomorrow 02/29/2016.   Consultants:    None  Procedures:   None  Antimicrobials:   None   Subjective: Patient laying in bed opens eyes to verbal stimuli. Asking for ice. Denies any shortness of breath. No chest pain.  Objective: Filed Vitals:   02/27/16 1301 02/27/16 2153 02/28/16 0635 02/28/16 1336  BP: 103/62 136/65 117/68 112/66  Pulse: 82 81 65 113  Temp: 99.2 F (37.3 C) 96.4 F (35.8 C) 97.8 F (36.6 C) 97.6 F (36.4 C)  TempSrc: Oral Axillary Oral Oral  Resp: 13 14 14 16   Weight:      SpO2: 100% 93% 100% 100%    Intake/Output Summary (Last 24 hours) at 02/28/16 1723 Last data filed at 02/27/16 1900  Gross per 24 hour  Intake    225 ml  Output      0 ml  Net    225 ml   Filed Weights   02/26/16 2045  Weight: 66.679 kg (147 lb)    Examination:  General exam: Appears calm and comfortable.Extremely dry mucous membranes. Respiratory system: Clear to auscultation anterior lung fields. Respiratory effort normal. Cardiovascular system: Tachycardia. No JVD, murmurs, rubs, gallops or clicks. No pedal edema. Gastrointestinal system: Abdomen is nondistended, soft and nontender. No organomegaly or masses felt. Normal bowel sounds heard. Central nervous system: Alert and oriented. No focal neurological deficits. Extremities: Symmetric 5 x 5 power. Skin: No rashes, lesions or ulcers Psychiatry: Judgement and insight appear normal. Mood & affect appropriate.     Data Reviewed: I have personally reviewed following labs and imaging studies  CBC:  Recent Labs Lab 02/25/16 1213  WBC 10.1  HGB 15.1*  HCT 48.8*  MCV 82.7  PLT 165   Basic Metabolic Panel:  Recent Labs Lab  02/25/16 1213 02/28/16 0332  NA 141 147*  K 3.1* 2.8*  CL 93* 117*  CO2 15* 23  GLUCOSE 242* 92  BUN 20 40*  CREATININE 2.65* 1.99*  CALCIUM 10.9* 9.2  MG  --  1.5*   GFR: Estimated Creatinine Clearance: 15.7 mL/min (by C-G formula based on Cr of 1.99). Liver Function Tests:  Recent Labs Lab 02/25/16 1213   AST 31  ALT 14  ALKPHOS 67  BILITOT 1.8*  PROT 7.2  ALBUMIN 2.8*   No results for input(s): LIPASE, AMYLASE in the last 168 hours. No results for input(s): AMMONIA in the last 168 hours. Coagulation Profile:  Recent Labs Lab 02/25/16 1321  INR 1.06   Cardiac Enzymes: No results for input(s): CKTOTAL, CKMB, CKMBINDEX, TROPONINI in the last 168 hours. BNP (last 3 results) No results for input(s): PROBNP in the last 8760 hours. HbA1C: No results for input(s): HGBA1C in the last 72 hours. CBG: No results for input(s): GLUCAP in the last 168 hours. Lipid Profile: No results for input(s): CHOL, HDL, LDLCALC, TRIG, CHOLHDL, LDLDIRECT in the last 72 hours. Thyroid Function Tests: No results for input(s): TSH, T4TOTAL, FREET4, T3FREE, THYROIDAB in the last 72 hours. Anemia Panel: No results for input(s): VITAMINB12, FOLATE, FERRITIN, TIBC, IRON, RETICCTPCT in the last 72 hours. Sepsis Labs:  Recent Labs Lab 02/25/16 1321 02/25/16 1524 02/26/16 1820  LATICACIDVEN 6.28* 5.79* 1.88    No results found for this or any previous visit (from the past 240 hour(s)).       Radiology Studies: No results found.      Scheduled Meds: . antiseptic oral rinse  7 mL Mouth Rinse q12n4p  . chlorhexidine  15 mL Mouth Rinse BID  . pantoprazole (PROTONIX) IV  40 mg Intravenous Q12H  . potassium chloride  10 mEq Intravenous Q1 Hr x 5   Continuous Infusions: . dextrose 5 % 1,000 mL with potassium chloride 40 mEq infusion 75 mL/hr at 02/28/16 0856     LOS: 2 days    Time spent: 35 minutes    THOMPSON,DANIEL, MD [facility] Pager [phone]  If 7PM-7AM, please contact night-coverage [email or web address] Password [facility] 02/28/2016, 5:23 PM

## 2016-02-29 DIAGNOSIS — K920 Hematemesis: Secondary | ICD-10-CM | POA: Clinically undetermined

## 2016-02-29 LAB — BASIC METABOLIC PANEL
ANION GAP: 5 (ref 5–15)
BUN: 33 mg/dL — ABNORMAL HIGH (ref 6–20)
CALCIUM: 9.2 mg/dL (ref 8.9–10.3)
CHLORIDE: 115 mmol/L — AB (ref 101–111)
CO2: 23 mmol/L (ref 22–32)
Creatinine, Ser: 1.64 mg/dL — ABNORMAL HIGH (ref 0.44–1.00)
GFR calc non Af Amer: 25 mL/min — ABNORMAL LOW (ref >60)
GFR, EST AFRICAN AMERICAN: 29 mL/min — AB (ref >60)
GLUCOSE: 116 mg/dL — AB (ref 65–99)
Potassium: 3.3 mmol/L — ABNORMAL LOW (ref 3.5–5.1)
Sodium: 143 mmol/L (ref 135–145)

## 2016-02-29 LAB — CBC
HEMATOCRIT: 31.8 % — AB (ref 36.0–46.0)
HEMOGLOBIN: 10.3 g/dL — AB (ref 12.0–15.0)
MCH: 26.1 pg (ref 26.0–34.0)
MCHC: 32.4 g/dL (ref 30.0–36.0)
MCV: 80.7 fL (ref 78.0–100.0)
Platelets: 104 10*3/uL — ABNORMAL LOW (ref 150–400)
RBC: 3.94 MIL/uL (ref 3.87–5.11)
RDW: 27.3 % — ABNORMAL HIGH (ref 11.5–15.5)
WBC: 6.5 10*3/uL (ref 4.0–10.5)

## 2016-02-29 LAB — MAGNESIUM: MAGNESIUM: 1.3 mg/dL — AB (ref 1.7–2.4)

## 2016-02-29 MED ORDER — ONDANSETRON HCL 4 MG PO TABS: 4.0000 mg | ORAL_TABLET | Freq: Four times a day (QID) | ORAL | Status: AC | PRN

## 2016-02-29 MED ORDER — POTASSIUM CHLORIDE 10 MEQ/100ML IV SOLN
10.0000 meq | INTRAVENOUS | Status: AC
  Administered 2016-02-29 (×2): 10 meq via INTRAVENOUS
  Filled 2016-02-29 (×2): qty 100

## 2016-02-29 MED ORDER — MIRTAZAPINE 15 MG PO TABS: 15.0000 mg | ORAL_TABLET | Freq: Every day | ORAL | Status: AC

## 2016-02-29 MED ORDER — MORPHINE SULFATE (CONCENTRATE) 20 MG/ML PO SOLN: 0.2000 mg | Freq: Four times a day (QID) | ORAL | Status: AC | PRN

## 2016-02-29 MED ORDER — POTASSIUM CHLORIDE 10 MEQ/100ML IV SOLN
10.0000 meq | INTRAVENOUS | Status: AC
  Administered 2016-02-29 (×3): 10 meq via INTRAVENOUS
  Filled 2016-02-29 (×4): qty 100

## 2016-02-29 MED ORDER — BISACODYL 10 MG RE SUPP: 10.0000 mg | RECTAL | Status: AC | PRN

## 2016-02-29 MED ORDER — MAGNESIUM SULFATE 4 GM/100ML IV SOLN
4.0000 g | Freq: Once | INTRAVENOUS | Status: AC
  Administered 2016-02-29: 4 g via INTRAVENOUS
  Filled 2016-02-29: qty 100

## 2016-02-29 MED ORDER — MENTHOL-ZINC OXIDE 0.44-20.625 % EX OINT: 1.0000 "application " | TOPICAL_OINTMENT | Freq: Two times a day (BID) | CUTANEOUS | Status: AC

## 2016-02-29 MED ORDER — LORAZEPAM 2 MG/ML PO CONC: 0.6000 mg | Freq: Four times a day (QID) | ORAL | Status: AC | PRN

## 2016-02-29 MED ORDER — PANTOPRAZOLE SODIUM 40 MG PO TBEC: 40.0000 mg | DELAYED_RELEASE_TABLET | Freq: Every day | ORAL | Status: AC

## 2016-02-29 NOTE — Progress Notes (Signed)
PT discharged home via [facility]. Scripts given to [facility]. Son Mary Mainland(Clancy) notified of [facility] arrival and pt discharge. Verbalized understanding

## 2016-02-29 NOTE — Progress Notes (Signed)
Pt to discharge to son's home today.  Pt [facility] both [facility].  To DC home via ambulance.  Son asked that pt arrive to the house at 4:30-5pm. RN has transport packet with [facility] number and will call the son when [facility] has been called for pt pick up. Sandford Crazeora Isidore Margraf RN,BSN,NCM [phone]

## 2016-02-29 NOTE — Care Management Important Message (Signed)
Important Message  Patient Details  Name: Mary Mcclain Mcclain: [id number] Date of Birth: 03/27/1917   Medicare Important Message Given:  Yes    Haskell FlirtJamison, Halah Whiteside 02/29/2016, 8:50 AMImportant Message  Patient Details  Name: Mary Mcclain Mcclain: [id number] Date of Birth: 03/08/1917   Medicare Important Message Given:  Yes    Haskell FlirtJamison, Mack Thurmon 02/29/2016, 8:50 AM

## 2016-02-29 NOTE — Discharge Summary (Signed)
Physician Discharge Summary  Trisa Cranor VWU:[id number] DOB: [birth year] DOA: 02/26/2016  PCP: No primary care provider on file.  Admit date: 02/26/2016 Discharge date: 02/29/2016  Time spent: 65 minutes  Recommendations for Outpatient Follow-up:  1. Patient will be discharged home with hospice following. Patient is to follow-up with hospice M.D.   Discharge Diagnoses:  Principal Problem:   Volume depletion Active Problems:   FTT (failure to thrive) in adult   Chronic atrial fibrillation (HCC)   Gastroesophageal reflux disease without esophagitis   Dying care   Hypokalemia   Hypomagnesemia   Renal failure (ARF), acute on chronic (HCC) stage 3   Coffee ground emesis   Discharge Condition: Stable  Diet recommendation: Regular/comfort feeds  Filed Weights   02/26/16 2045  Weight: 66.679 kg (147 lb)    History of present illness:  Per Dr Olga Millers is a 82 y.o. female with medical history significant of CKD, chronic atrial fibrillation, anemia, GERD, DNR/DNI who is a resident at [facility], was seen at [facility] 1 day prior to admission due to coffee ground emesis and was now brought to [facility] ED due to dehydration and hypotension.  The patient was unable to provide history at this time, but on a recent palliative care consult she did not want to be hospitalized in the future. However, at this point, she is unable to make decisions and family members would like her to get IVF before they return with her to the [facility] facility.  ED Course: Initially, the patient was hypotensive and tachycardic, but this improved with one liter of NS bolus. [facility] admitted her for comfort care.   Hospital Course:  #1 failure to thrive/dehydration/hypotension Patient presented with significant dehydration volume depletion and noted to be hypotensive on admission.Likely due to poor oral intake. Patient was noted to have a failure to thrive. Blood pressure responded to IV  fluids. Prior to admission patient was noted to have some coffee-ground emesis and was seen in the ED 1 day prior to admission. It was noted in prior palliative care notes from March 2017 that patient did not want to be hospitalized in the future with minimal interventions. Patient did not have any further coffee-ground emesis patient was hydrated with IV fluids with resolution of hypotension. Patient was placed on supportive care. It was noted that patient was likely in the dying process. Spoke to son and decision was made to transition to full comfort care on discharge with hospice to follow patient at home. Patient be discharged home with hospice.  #2 hypokalemia/hypomagnesemia Patient was noted to be hypokalemic and hypomagnesemic during the hospitalization. Patient's electrolytes were repleted.   #3 chronic atrial fibrillation Chads2vasc score 4 Currently stable. Patient noted to initially be tachycardic on admission which improved with hydration. Patient's oral medications were held. Coumadin was held secondary to recent coffee ground emesis. Will not resume Coumadin on discharge as patient will be discharged home with hospice.   #4 gastroesophageal reflux disease Patient was maintained on a PPI. Patient be discharged home on a PPI.  #5 acute on chronic kidney disease stage III Likely secondary to prerenal azotemia. Patient was hydrated with IV fluids with improvement in renal function. Patient's creatinine on admission was 2.65 and by discharge creatinine was down to 1.64. Patient will be discharged home with hospice.   #6 recent coffee-ground emesis No further episodes. H&H remained stable. Patient was maintained on a PPI. Coumadin was held during the hospitalization and will not be resumed on  discharge. Patient will be discharged home with hospice.   Procedures:  None  Consultations: None  Discharge Exam: Filed Vitals:   02/28/16 2042 02/29/16 0500  BP: 77/65 112/76  Pulse: 100  98  Temp: 98 F (36.7 C) 98 F (36.7 C)  Resp: 12     General: NAD Cardiovascular: RRR Respiratory: CTAB  Discharge Instructions   Discharge Instructions    Diet general    Complete by:  As directed   Comfort feeds.     Discharge instructions    Complete by:  As directed   Follow up with hospice MD .     Increase activity slowly    Complete by:  As directed           Current Discharge Medication List    START taking these medications   Details  ondansetron (ZOFRAN) 4 MG tablet Take 1 tablet (4 mg total) by mouth every 6 (six) hours as needed for nausea. Qty: 15 tablet, Refills: 0    pantoprazole (PROTONIX) 40 MG tablet Take 1 tablet (40 mg total) by mouth daily. Qty: 30 tablet, Refills: 0      CONTINUE these medications which have CHANGED   Details  bisacodyl (DULCOLAX) 10 MG suppository Place 1 suppository (10 mg total) rectally as needed for mild constipation or moderate constipation. Qty: 12 suppository, Refills: 0    LORazepam (ATIVAN) 2 MG/ML concentrated solution Take 0.3 mLs (0.6 mg total) by mouth every 6 (six) hours as needed for anxiety. Qty: 30 mL, Refills: 0    Menthol-Zinc Oxide 0.44-20.625 % OINT Apply 1 application topically 2 (two) times daily. To buttocks Qty: 113 g, Refills: 0    mirtazapine (REMERON) 15 MG tablet Take 1 tablet (15 mg total) by mouth at bedtime. Qty: 30 tablet, Refills: 0    morphine (ROXANOL) 20 MG/ML concentrated solution Take 0.01 mLs (0.2 mg total) by mouth every 6 (six) hours as needed for severe pain. Qty: 30 mL, Refills: 0      CONTINUE these medications which have NOT CHANGED   Details  acetaminophen (TYLENOL) 325 MG tablet Take 650 mg by mouth every 4 (four) hours as needed for mild pain.    !! ENSURE (ENSURE) Take 237 mLs by mouth 2 (two) times daily.    hydrocortisone cream 1 % Apply 1 application topically 2 (two) times daily. Apply to back    Multiple Vitamins-Minerals (MULTI ADULT GUMMIES PO) Take by mouth  daily. (pediatric multivit-minerals-vitamin C)    !! Nutritional Supplements (NUTRITIONAL DRINK PO) Take 1 each by mouth daily. Mighty Shake    OXYGEN Inhale 2 L into the lungs daily. Continuously     !! - Potential duplicate medications found. Please discuss with provider.    STOP taking these medications     warfarin (COUMADIN) 1 MG tablet        Allergies  Allergen Reactions  . Sulfa Antibiotics Other (See Comments)    Pt stated it has been so long ago that she didn't remember what happens   Follow-up Information    Please follow up.   Why:  f/u with hospice MD        The results of significant diagnostics from this hospitalization (including imaging, microbiology, ancillary and laboratory) are listed below for reference.    Significant Diagnostic Studies: No results found.  Microbiology: No results found for this or any previous visit (from the past 240 hour(s)).   Labs: Basic Metabolic Panel:  Recent Labs Lab 02/25/16 1213  02/28/16 0332 02/29/16 0338  NA 141 147* 143  K 3.1* 2.8* 3.3*  CL 93* 117* 115*  CO2 15* 23 23  GLUCOSE 242* 92 116*  BUN 20 40* 33*  CREATININE 2.65* 1.99* 1.64*  CALCIUM 10.9* 9.2 9.2  MG  --  1.5* 1.3*   Liver Function Tests:  Recent Labs Lab 02/25/16 1213  AST 31  ALT 14  ALKPHOS 67  BILITOT 1.8*  PROT 7.2  ALBUMIN 2.8*   No results for input(s): LIPASE, AMYLASE in the last 168 hours. No results for input(s): AMMONIA in the last 168 hours. CBC:  Recent Labs Lab 02/25/16 1213 02/29/16 0338  WBC 10.1 6.5  HGB 15.1* 10.3*  HCT 48.8* 31.8*  MCV 82.7 80.7  PLT 165 104*   Cardiac Enzymes: No results for input(s): CKTOTAL, CKMB, CKMBINDEX, TROPONINI in the last 168 hours. BNP: BNP (last 3 results) No results for input(s): BNP in the last 8760 hours.  ProBNP (last 3 results) No results for input(s): PROBNP in the last 8760 hours.  CBG: No results for input(s): GLUCAP in the last 168  hours.     SignedRamiro Harvest:  Emigdio Wildeman MD.  [facility] 02/29/2016, 3:02 PM

## 2016-05-14 DEATH — deceased

## 2017-07-09 IMAGING — CT CT ABD-PELV W/O CM
2 of 4 series · 10 of 46 positions shown, 11 images · non-contrast
Comparison: Lumbar radiography [DATE]/[id number].

CLINICAL DATA: Fell several days ago. Low back and abdominal pain.
Weakness and confusion.

EXAM:
CT ABDOMEN AND PELVIS WITHOUT CONTRAST
TECHNIQUE: Multidetector CT imaging of the abdomen and pelvis was performed
following the standard protocol without IV contrast.

[Series 201: routine, idose (2) · axial · 0.78mm/px · z∈[-482,-87]mm · 7 of 97 slices shown, 8 images]
[im 9/97  soft-tissue]
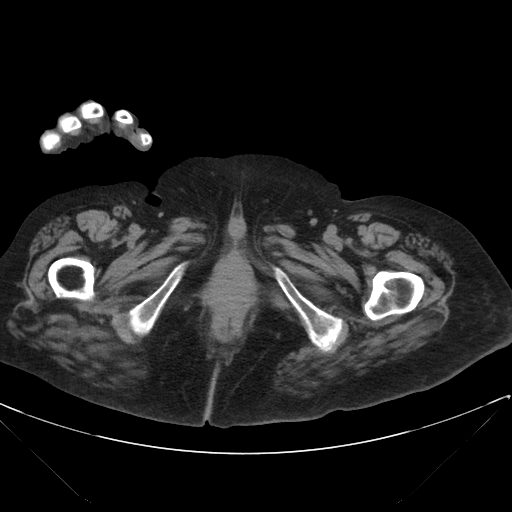
[im 9/97  bone]
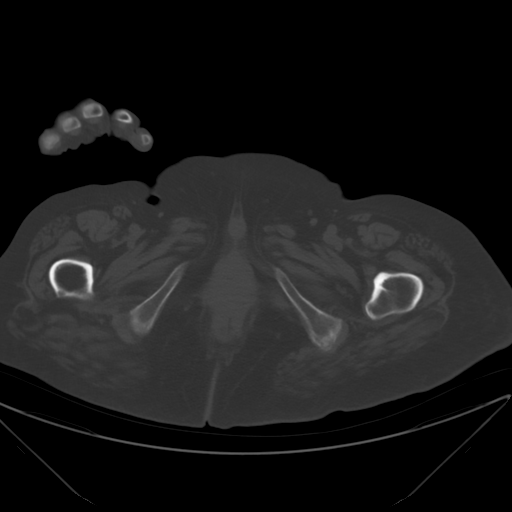
[im 21/97  soft-tissue]
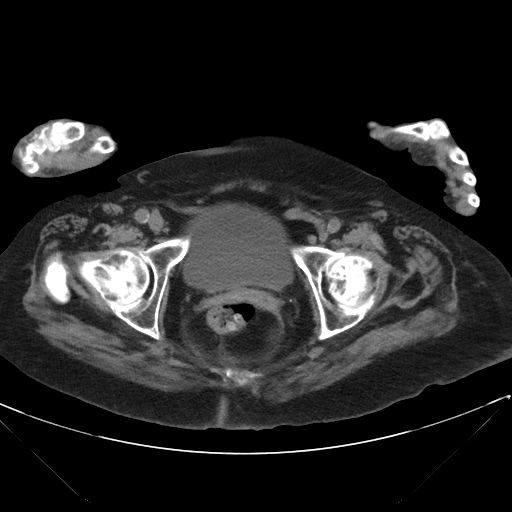
[im 34/97  soft-tissue]
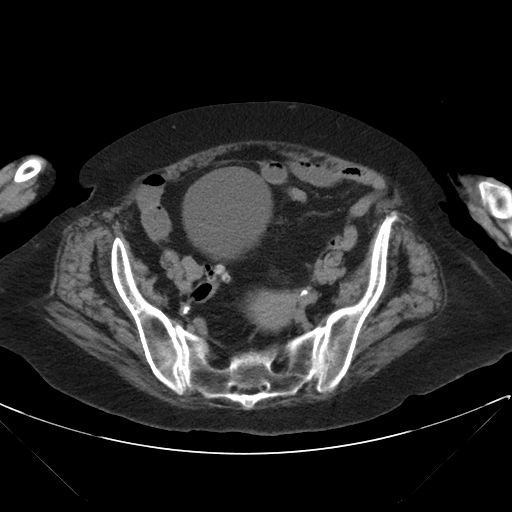
[im 51/97  soft-tissue]
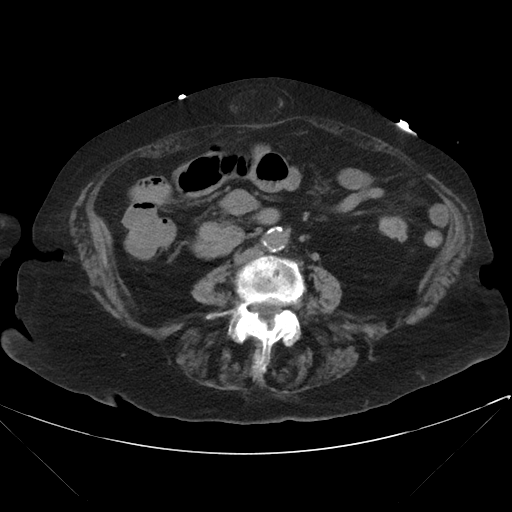
[im 63/97  soft-tissue]
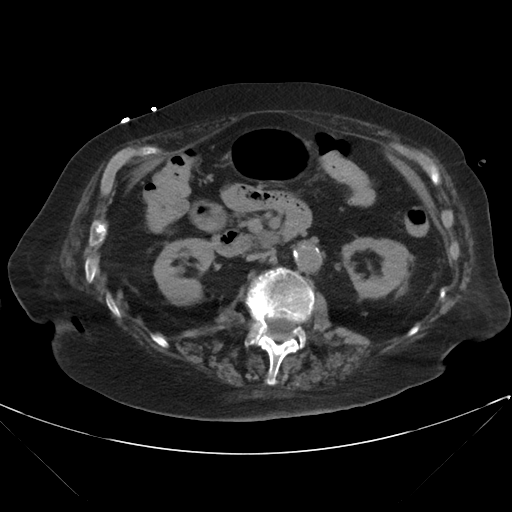
[im 76/97  soft-tissue]
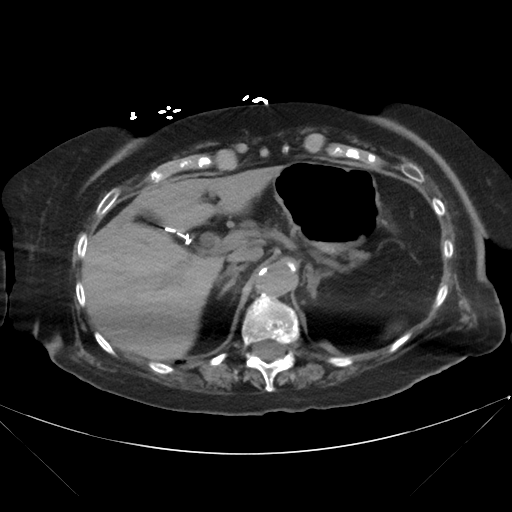
[im 88/97  soft-tissue]
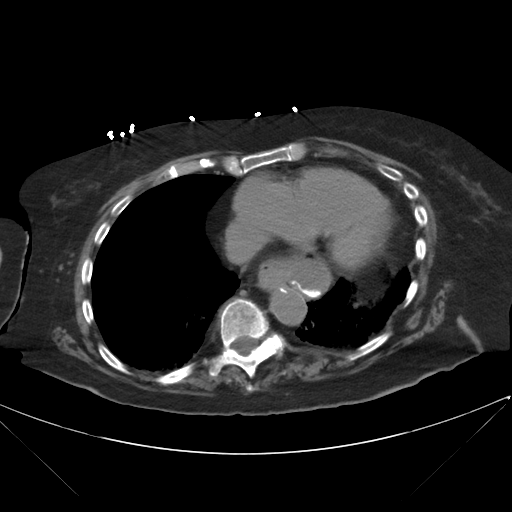

[Series 203: coronals, idose (2) · coronal · 0.45mm/px · 3 of 138 slices shown]
[im 46/138  soft-tissue]
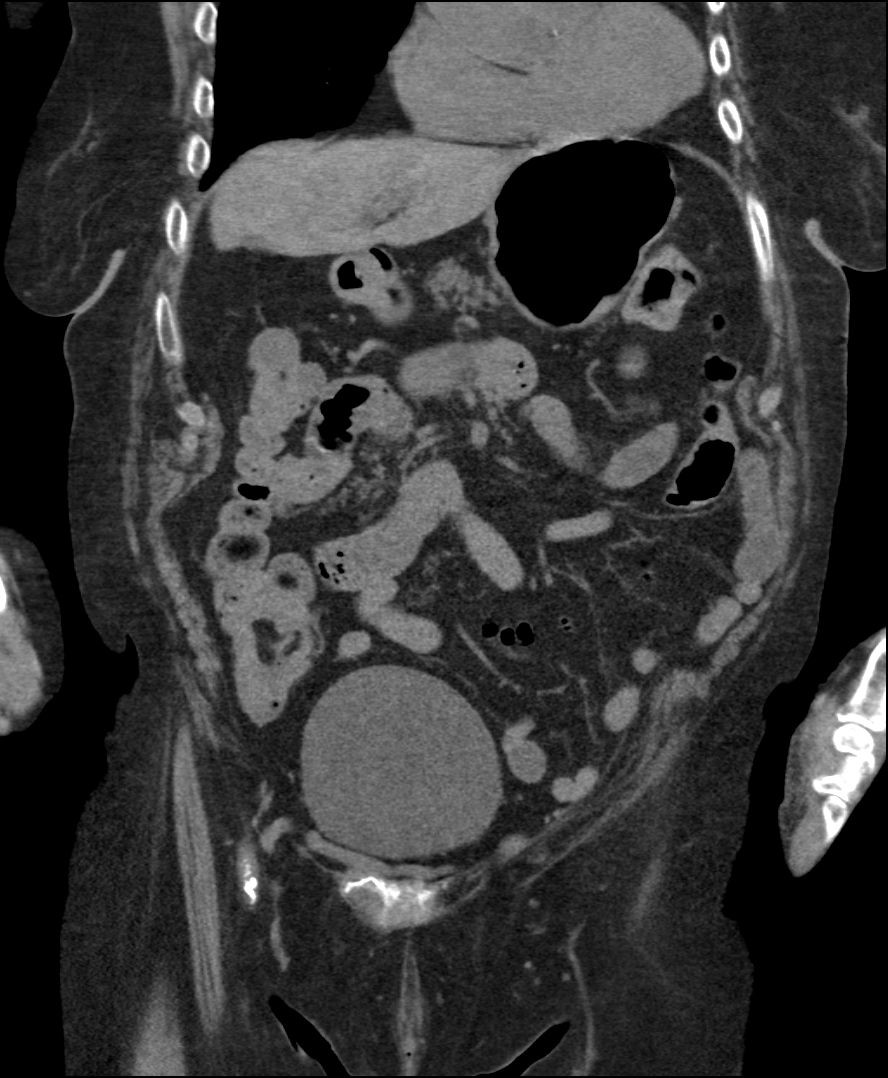
[im 61/138  soft-tissue]
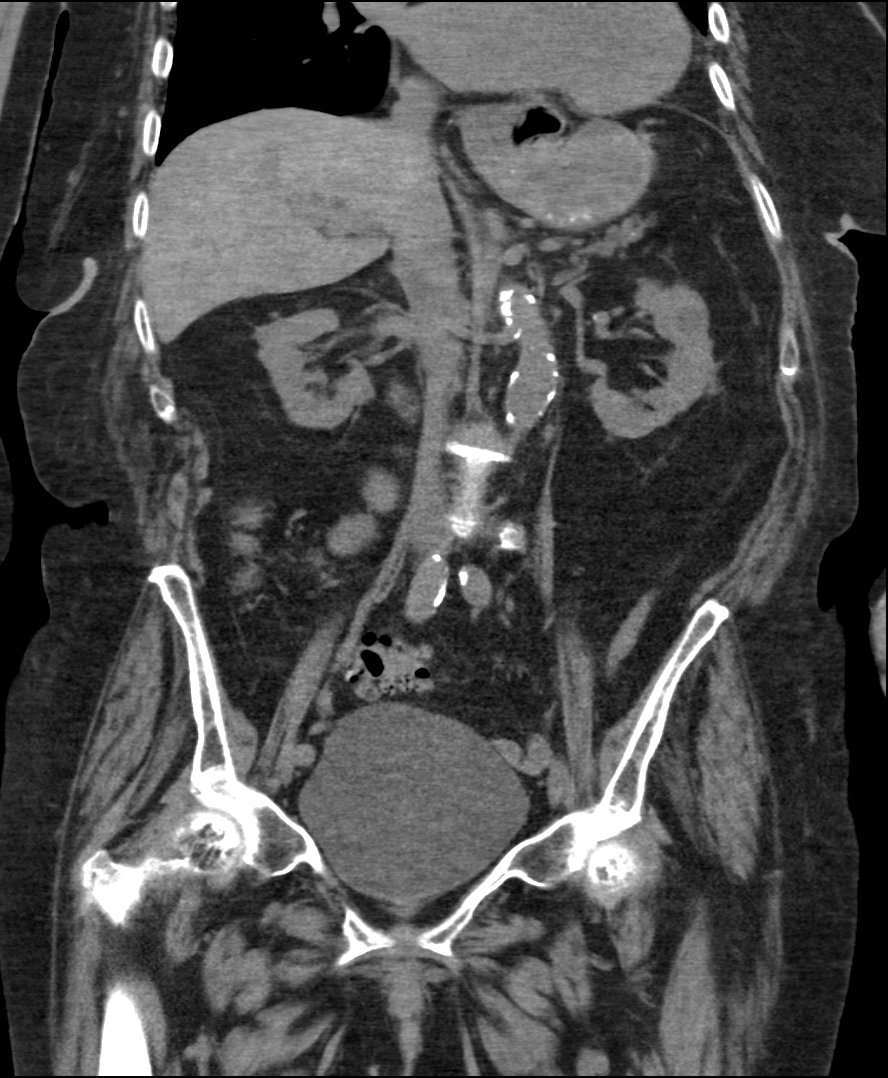
[im 77/138  soft-tissue]
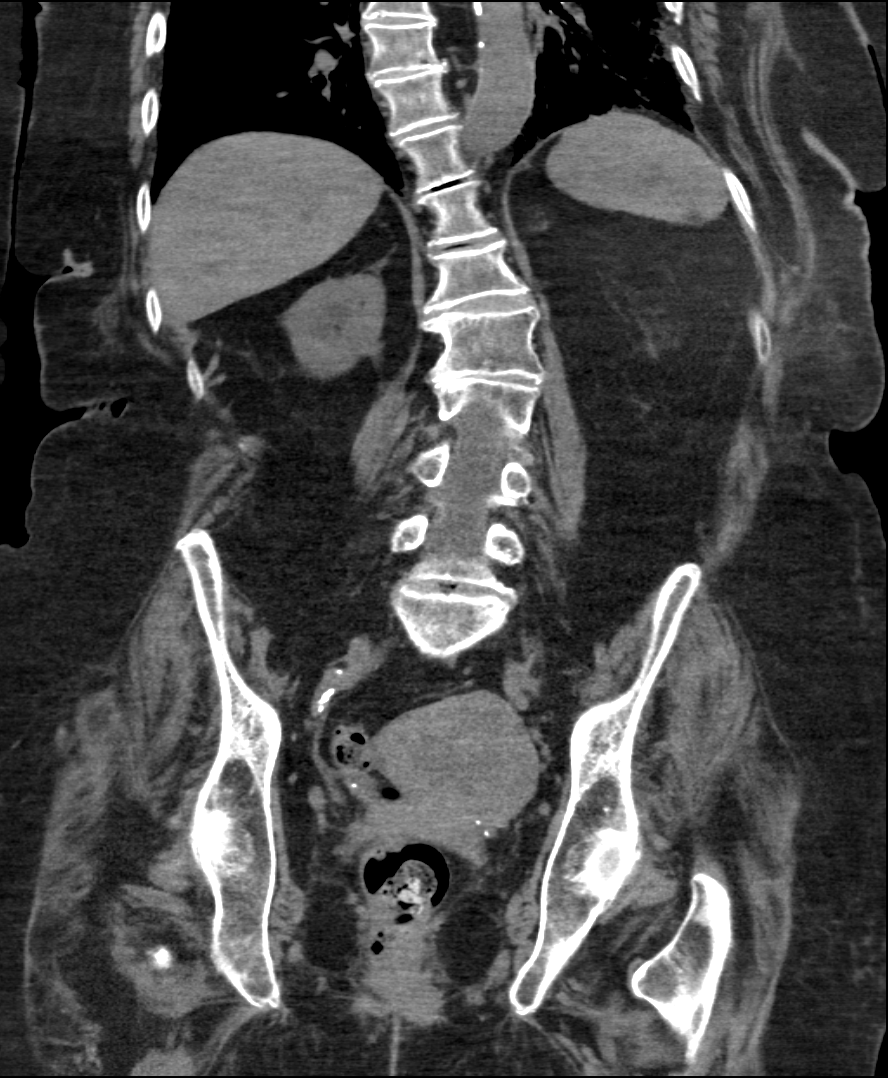

[10 of 46 positions shown; findings below may reference images not displayed]

FINDINGS: There is bronchiectasis in the left lower lobe with some atelectasis
and possible mild patchy infiltrate. Right lung bases clear. No
pleural fluid. There is a hiatal hernia

The liver is normal except for a 1.9 cm cyst or hemangioma within
the medial segment of the left lobe. Previous cholecystectomy. The
spleen is normal. The pancreas is normal with some atrophic change.
The adrenal glands are normal. The left kidney contains a 2 mm stone
in the lower pole. There are a few small cysts. No hydronephrosis.
The right kidney contains 2 mm stone in the midportion. Several
cysts are present, the largest measuring 2.5 cm in the midportion.
There is atherosclerotic change of the aorta but no aneurysm. The
IVC is normal. No retroperitoneal mass or adenopathy. No free
intraperitoneal fluid or air. No acute bowel pathology. There is a
ventral hernia containing mesenteric fat. There is diverticulosis
without evidence of diverticulitis. The bladder appears normal.
There is a uterine mass most consistent with leiomyoma measuring
about 4 x 5 cm. No adnexal lesion. There is curvature in chronic
degenerative change of the spine. No evidence of spinal fracture or
pelvic fracture.
IMPRESSION: No acute finding by noncontrast CT.

Bronchiectasis in the left lower lobe with some volume loss and
possible mild patchy infiltrate.

A hiatal hernia.

Ventral abdominal hernia containing only fat.

Atherosclerosis of the aorta and its branch vessels without
aneurysm.

Liver cyst and renal cysts.

Diverticulosis without evidence of diverticulitis.

## 2017-08-01 IMAGING — DX DG CHEST 2V
2 series · 2 of 2 positions shown · non-contrast
Comparison: December 06, 2015

CLINICAL DATA: Chest/back pain.  Altered mental status

EXAM:
CHEST  2 VIEW

[chest lat]
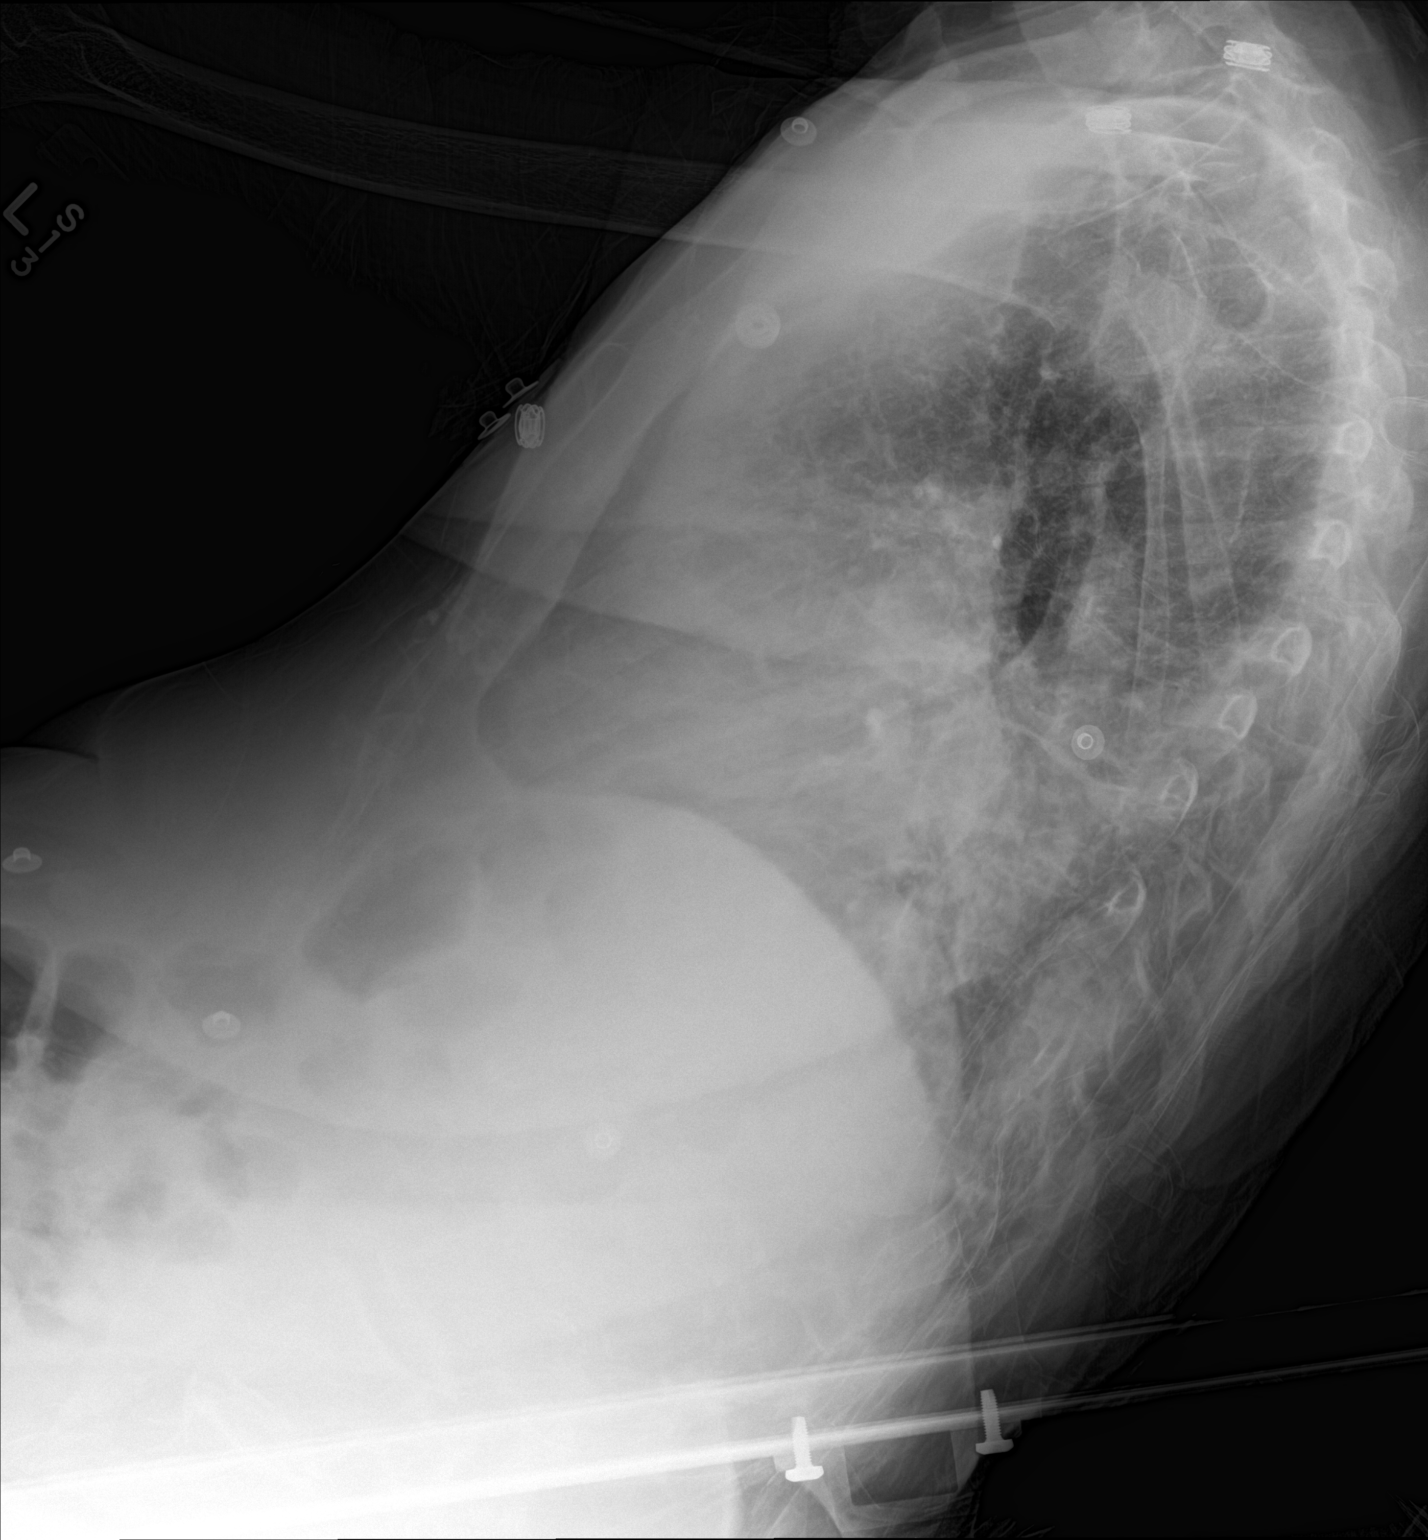

[chest ap]
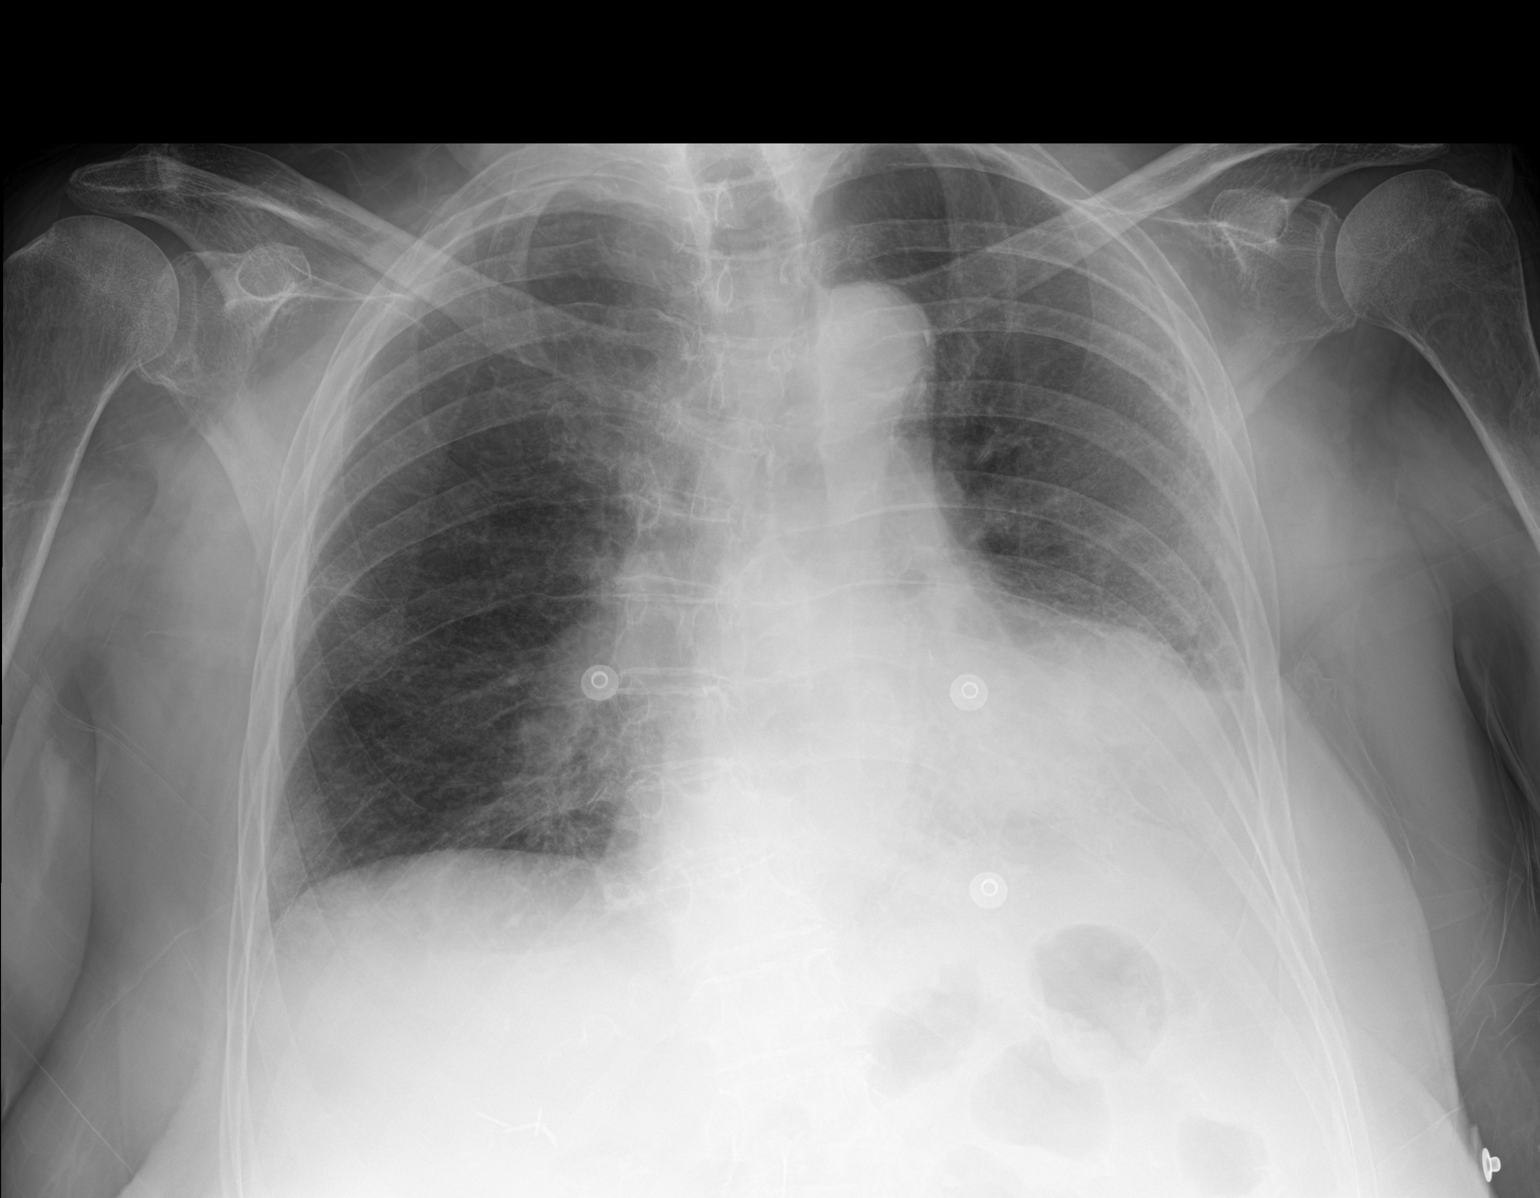

[2 of 2 positions shown; findings below may reference images not displayed]

FINDINGS: There is patchy airspace consolidation in the left lower lobe. There
is slight scarring in the right mid lung. Lungs elsewhere clear.
Heart remains enlarged with pulmonary vascularity within normal
limits. No adenopathy. There is atherosclerotic change in the aorta.
There is upper thoracic levoscoliosis with mid and lower thoracic
dextroscoliosis.
IMPRESSION: Left lower lobe consolidation. Question pneumonia or possibly
aspiration pneumonitis in this area. Slight scarring right mid lung.
Lungs elsewhere clear. Stable cardiac enlargement.
